# Patient Record
Sex: Male | Born: 1935 | Race: White | Hispanic: No | Marital: Married
Health system: Southern US, Community
[De-identification: ages and names within clinical notes are randomized; demographics above are authoritative.]

## PROBLEM LIST (undated history)

## (undated) DIAGNOSIS — E785 Hyperlipidemia, unspecified: Secondary | ICD-10-CM

## (undated) DIAGNOSIS — H903 Sensorineural hearing loss, bilateral: Secondary | ICD-10-CM

## (undated) DIAGNOSIS — F3132 Bipolar disorder, current episode depressed, moderate: Secondary | ICD-10-CM

## (undated) DIAGNOSIS — I2510 Atherosclerotic heart disease of native coronary artery without angina pectoris: Secondary | ICD-10-CM

## (undated) DIAGNOSIS — F32A Depression, unspecified: Secondary | ICD-10-CM

## (undated) DIAGNOSIS — N400 Enlarged prostate without lower urinary tract symptoms: Secondary | ICD-10-CM

## (undated) DIAGNOSIS — N35919 Unspecified urethral stricture, male, unspecified site: Secondary | ICD-10-CM

## (undated) DIAGNOSIS — C61 Malignant neoplasm of prostate: Secondary | ICD-10-CM

## (undated) DIAGNOSIS — I10 Essential (primary) hypertension: Secondary | ICD-10-CM

## (undated) DIAGNOSIS — Z8546 Personal history of malignant neoplasm of prostate: Secondary | ICD-10-CM

## (undated) DIAGNOSIS — Z86010 Personal history of colonic polyps: Secondary | ICD-10-CM

## (undated) DIAGNOSIS — Z973 Presence of spectacles and contact lenses: Secondary | ICD-10-CM

## (undated) DIAGNOSIS — N529 Male erectile dysfunction, unspecified: Secondary | ICD-10-CM

## (undated) DIAGNOSIS — N401 Enlarged prostate with lower urinary tract symptoms: Secondary | ICD-10-CM

## (undated) DIAGNOSIS — Z972 Presence of dental prosthetic device (complete) (partial): Secondary | ICD-10-CM

## (undated) DIAGNOSIS — Z860101 Personal history of adenomatous and serrated colon polyps: Secondary | ICD-10-CM

## (undated) DIAGNOSIS — Z87440 Personal history of urinary (tract) infections: Secondary | ICD-10-CM

## (undated) DIAGNOSIS — E291 Testicular hypofunction: Secondary | ICD-10-CM

## (undated) DIAGNOSIS — R339 Retention of urine, unspecified: Secondary | ICD-10-CM

## (undated) DIAGNOSIS — N138 Other obstructive and reflux uropathy: Secondary | ICD-10-CM

## (undated) DIAGNOSIS — I252 Old myocardial infarction: Secondary | ICD-10-CM

## (undated) DIAGNOSIS — K5730 Diverticulosis of large intestine without perforation or abscess without bleeding: Secondary | ICD-10-CM

## (undated) HISTORY — PX: GREEN LIGHT LASER TURP (TRANSURETHRAL RESECTION OF PROSTATE: SHX6260

## (undated) HISTORY — PX: CARDIOVASCULAR STRESS TEST: SHX262

## (undated) HISTORY — DX: Hyperlipidemia, unspecified: E78.5

## (undated) HISTORY — PX: OTHER SURGICAL HISTORY: SHX169

---

## 1989-06-05 HISTORY — PX: CORONARY ANGIOPLASTY: SHX604

## 1998-03-03 ENCOUNTER — Ambulatory Visit (HOSPITAL_COMMUNITY): Admission: RE | Admit: 1998-03-03 | Discharge: 1998-03-03 | Payer: Self-pay | Admitting: Cardiology

## 2005-10-04 ENCOUNTER — Encounter: Payer: Self-pay | Admitting: Cardiology

## 2006-02-07 ENCOUNTER — Encounter: Payer: Self-pay | Admitting: Cardiology

## 2006-06-15 ENCOUNTER — Ambulatory Visit (HOSPITAL_COMMUNITY): Admission: RE | Admit: 2006-06-15 | Discharge: 2006-06-15 | Payer: Self-pay | Admitting: Internal Medicine

## 2007-02-12 ENCOUNTER — Ambulatory Visit: Payer: Self-pay | Admitting: Internal Medicine

## 2007-03-05 ENCOUNTER — Emergency Department (HOSPITAL_COMMUNITY): Admission: EM | Admit: 2007-03-05 | Discharge: 2007-03-05 | Payer: Self-pay | Admitting: Emergency Medicine

## 2007-03-06 ENCOUNTER — Emergency Department (HOSPITAL_COMMUNITY): Admission: EM | Admit: 2007-03-06 | Discharge: 2007-03-06 | Payer: Self-pay | Admitting: Emergency Medicine

## 2007-04-25 ENCOUNTER — Ambulatory Visit: Payer: Self-pay | Admitting: Internal Medicine

## 2007-08-27 DIAGNOSIS — E785 Hyperlipidemia, unspecified: Secondary | ICD-10-CM

## 2007-08-27 DIAGNOSIS — I2510 Atherosclerotic heart disease of native coronary artery without angina pectoris: Secondary | ICD-10-CM

## 2007-08-27 DIAGNOSIS — K648 Other hemorrhoids: Secondary | ICD-10-CM | POA: Insufficient documentation

## 2007-08-27 DIAGNOSIS — I252 Old myocardial infarction: Secondary | ICD-10-CM | POA: Insufficient documentation

## 2007-08-27 DIAGNOSIS — Z87898 Personal history of other specified conditions: Secondary | ICD-10-CM

## 2007-08-27 DIAGNOSIS — K5730 Diverticulosis of large intestine without perforation or abscess without bleeding: Secondary | ICD-10-CM | POA: Insufficient documentation

## 2008-01-14 ENCOUNTER — Ambulatory Visit: Admission: RE | Admit: 2008-01-14 | Discharge: 2008-02-18 | Payer: Self-pay | Admitting: Radiation Oncology

## 2008-03-26 ENCOUNTER — Ambulatory Visit: Admission: RE | Admit: 2008-03-26 | Discharge: 2008-05-11 | Payer: Self-pay | Admitting: Radiation Oncology

## 2008-03-27 LAB — PSA: PSA: 4.18 ng/mL — ABNORMAL HIGH (ref 0.10–4.00)

## 2008-06-03 ENCOUNTER — Encounter: Admission: RE | Admit: 2008-06-03 | Discharge: 2008-06-03 | Payer: Self-pay | Admitting: Urology

## 2008-06-10 ENCOUNTER — Ambulatory Visit (HOSPITAL_BASED_OUTPATIENT_CLINIC_OR_DEPARTMENT_OTHER): Admission: RE | Admit: 2008-06-10 | Discharge: 2008-06-10 | Payer: Self-pay | Admitting: Urology

## 2008-06-10 HISTORY — PX: OTHER SURGICAL HISTORY: SHX169

## 2008-06-25 ENCOUNTER — Ambulatory Visit: Admission: RE | Admit: 2008-06-25 | Discharge: 2008-08-06 | Payer: Self-pay | Admitting: Radiation Oncology

## 2008-08-27 ENCOUNTER — Ambulatory Visit: Payer: Self-pay | Admitting: Urology

## 2008-08-31 ENCOUNTER — Ambulatory Visit: Payer: Self-pay | Admitting: Urology

## 2009-05-11 DIAGNOSIS — E291 Testicular hypofunction: Secondary | ICD-10-CM | POA: Insufficient documentation

## 2009-05-11 DIAGNOSIS — C61 Malignant neoplasm of prostate: Secondary | ICD-10-CM | POA: Insufficient documentation

## 2009-05-11 DIAGNOSIS — F331 Major depressive disorder, recurrent, moderate: Secondary | ICD-10-CM | POA: Insufficient documentation

## 2010-04-21 ENCOUNTER — Ambulatory Visit: Payer: Self-pay | Admitting: Cardiology

## 2010-06-05 HISTORY — PX: TRANSURETHRAL RESECTION OF PROSTATE: SHX73

## 2010-10-18 NOTE — Op Note (Signed)
NAMENORI, POLAND               ACCOUNT NO.:  [id number]   MEDICAL RECORD NO.:  [id number]          PATIENT TYPE:  AMB   LOCATION:  [facility]                         FACILITY:  [facility]   PHYSICIAN:  Maretta Bees. Vonita Moss, M.D.DATE OF BIRTH:  24-Dec-1933   DATE OF PROCEDURE:  06/10/2008  DATE OF DISCHARGE:                               OPERATIVE REPORT   PREOPERATIVE DIAGNOSIS:  Prostatic carcinoma.   POSTOPERATIVE DIAGNOSIS:  Prostatic carcinoma.   PROCEDURE:  Radioactive seed implantation to the prostate and cystoscopy  with seed retrieval.   SURGEON:  Dr. Larey Dresser   ASSISTANT:  Dr. Margaretmary Dys   INDICATIONS:  This 77 year old gentleman who is in good health with a  long history of bladder outlet obstructive symptoms was diagnosed as  having Gleason 6 carcinoma of the prostate.  He was counseled by me  regarding observation, surgery and the option of radiotherapy.  He also  saw Dr. Eldridge Abrahams at [facility] for a urologic opinion and also saw Dr. Nedra Hai,  the radiation oncologist at [facility].  He opted to proceed with radioactive  seed implantation here after consultation with Dr. Kathrynn Running.  He was  advised about postoperative voiding complaints and frequency.  He has  longstanding bladder outlet obstructive symptoms.  He has been on one  Flomax a day.  He has a known small median lobe of the prostate which  was diagnosed a while ago and he opted for continued medical therapy.   PROCEDURE:  The patient brought to operating room and placed in the  lithotomy position.  The external genitalia and perineum were prepped  and draped in the usual fashion.  A Foley catheter was inserted.  A  rectal tube was inserted.  Transrectal ultrasound was performed and seed  placement planning was completed and then with two anchoring needles in  place, 23 active needles were placed delivering 65 seeds in good  fluoroscopic and ultrasonic position.  However, at this point I  cystoscoped him and there was a  seed just protruding from the floor of  the prostate and just proximal to the verumontanum.  Fortunately I was  able to remove this seed with a grasper and the  rest of the urethra and the bladder was unremarkable except for a  trabeculated bladder and a small median lobe of the prostate.  A Foley  catheter was inserted.  He was taken to recovery room in good condition  having tolerated the procedure well.      Maretta Bees. Vonita Moss, M.D.  Electronically Signed     LJP/MEDQ  D:  06/10/2008  T:  06/10/2008  Job:  [id number]   cc:   Artist Pais Kathrynn Running, M.D.  Fax: [phone]   Gaspar Garbe, M.D.  Fax: [phone]

## 2010-10-18 NOTE — Assessment & Plan Note (Signed)
[facility]                         GASTROENTEROLOGY OFFICE NOTE   [name], [name]                      MRN:          [id number]  DATE:02/12/2007                            DOB:          20-Apr-1934    REASON FOR CONSULTATION:  Surveillance colonoscopy.   HISTORY:  This is a 77 year old white male with a history of coronary  artery disease and dyslipidemia who presents today regarding  surveillance colonoscopy.  He initially underwent colonoscopy June 10, 2003 for routine screening.  He was found to have multiple colon polyps  which were removed and found to be both adenomatous as well as  hyperplastic in nature, also pandiverticulosis and hemorrhoids.  He  tells me that last November he had problems with moderately severe  rectal bleeding which resolved in a day or so.  No recurrent problems  with bleeding.  He denies any other GI complaints.  He has had no  significant interval medical problems.   ALLERGIES:  SULFA.   CURRENT MEDICATIONS:  1. Flomax 0.4 mg daily.  2. Lipitor 20 mg daily.  3. Aspirin 81 mg daily.  4. Multivitamin.   PAST MEDICAL HISTORY:  1. Coronary artery disease.  2. Dyslipidemia.  3. Benign prostatic hypertrophy.   FAMILY HISTORY:  Sister with colon cancer at age 16, also sister with a  history of ovarian cancer.  Brother with prostate and pancreatic cancer.   SOCIAL HISTORY:  Patient is married with 2 sons.  Lives with his wife.  Retired from [employer]  Does not smoke.  Occasionally uses alcohol.   REVIEW OF SYSTEMS:  Per diagnostic evaluation form.   PHYSICAL EXAMINATION:  Well-appearing male, no acute distress.  Blood  pressure 136/94m heart rate is 60 and regular, weight is 163 pounds, he  is 5 feet 8 inches in height.  HEENT:  Sclerae are anicteric, conjunctivae are pink, oral mucosa is  intact.  No adenopathy.  LUNGS:  Clear.  HEART:  Regular.  ABDOMEN:  Soft without tenderness, mass or hernia.  RECTAL:  Deferred.  EXTREMITIES:  Without edema.   IMPRESSION:  1. Prior problems with transient rectal bleeding, cause uncertain.      Possibly hemorrhoids.  2. History of adenomatous colon polyps.  3. History of diverticulosis.  4. Family history of colon cancer.   RECOMMENDATIONS:  Colonoscopy with polypectomy if necessary.  The nature  of the procedure as well as the risks, benefits and alternatives were  discussed in detail.  He understood and agreed to proceed.     Wilhemina Bonito. Marina Goodell, MD  Electronically Signed    JNP/MedQ  DD: 02/12/2007  DT: 02/13/2007  Job #: [id number]   cc:   Gaspar Garbe, M.D.

## 2011-01-31 DIAGNOSIS — H538 Other visual disturbances: Secondary | ICD-10-CM | POA: Insufficient documentation

## 2011-03-07 LAB — COMPREHENSIVE METABOLIC PANEL
AST: 31 U/L (ref 0–37)
Albumin: 3.7 g/dL (ref 3.5–5.2)
Alkaline Phosphatase: 50 U/L (ref 39–117)
BUN: 11 mg/dL (ref 6–23)
GFR calc Af Amer: >60 mL/min (ref >60)
Potassium: 4 mEq/L (ref 3.5–5.1)
Total Protein: 6.1 g/dL (ref 6.0–8.3)

## 2011-03-07 LAB — PROTIME-INR: INR: 1 (ref 0.00–1.49)

## 2011-03-07 LAB — APTT: aPTT: 28 seconds (ref 24–37)

## 2011-03-07 LAB — CBC
HCT: 42.4 % (ref 39.0–52.0)
Platelets: 183 10*3/uL (ref 150–400)
RDW: 14.3 % (ref 11.5–15.5)

## 2011-03-10 LAB — COMPREHENSIVE METABOLIC PANEL
ALT: 45 U/L (ref 0–53)
Alkaline Phosphatase: 47 U/L (ref 39–117)
CO2: 31 mEq/L (ref 19–32)
GFR calc non Af Amer: >60 mL/min (ref >60)
Glucose, Bld: 88 mg/dL (ref 70–99)
Potassium: 4.8 mEq/L (ref 3.5–5.1)
Sodium: 139 mEq/L (ref 135–145)

## 2011-03-10 LAB — CBC
Hemoglobin: 14.4 g/dL (ref 13.0–17.0)
MCHC: 33.4 g/dL (ref 30.0–36.0)
RBC: 4.65 MIL/uL (ref 4.22–5.81)

## 2011-03-10 LAB — PROTIME-INR: Prothrombin Time: 13.7 seconds (ref 11.6–15.2)

## 2011-03-16 LAB — BASIC METABOLIC PANEL
CO2: 28
Calcium: 10.1
Creatinine, Ser: 1.12
Glucose, Bld: 118 — ABNORMAL HIGH

## 2011-03-16 LAB — SAMPLE TO BLOOD BANK

## 2011-03-16 LAB — CBC
MCHC: 33.2
RDW: 14.1 — ABNORMAL HIGH

## 2011-06-29 DIAGNOSIS — Z2839 Other underimmunization status: Secondary | ICD-10-CM | POA: Insufficient documentation

## 2011-06-29 DIAGNOSIS — R339 Retention of urine, unspecified: Secondary | ICD-10-CM | POA: Insufficient documentation

## 2011-10-19 ENCOUNTER — Encounter: Payer: Self-pay | Admitting: *Deleted

## 2012-04-12 ENCOUNTER — Encounter: Payer: Self-pay | Admitting: Internal Medicine

## 2012-06-03 ENCOUNTER — Other Ambulatory Visit: Payer: Self-pay | Admitting: Urology

## 2012-06-06 ENCOUNTER — Encounter (HOSPITAL_BASED_OUTPATIENT_CLINIC_OR_DEPARTMENT_OTHER): Payer: Self-pay | Admitting: *Deleted

## 2012-06-06 NOTE — Progress Notes (Addendum)
Pt instructed npo p mn 1/5 x lipitor w sip of water.  To [facility] 1/6 @1030 .  Needs hgb,psa, ekg on arrival

## 2012-06-07 NOTE — H&P (Signed)
Reason For Visit      [name] [name] presents today for followup. When I saw him for the first time in August 2013 he was having moderate to significant voiding complaints and had a recent episode of acute cystitis. He had a postvoid residual 160 cc at that time we recommended an alpha-blocker followed by a followup cystoscopy. He canceled that appointment he did not take the alpha-blocker. The patient subsequently came in and saw one of our nurse practitioners with really what sounds like almost an overflow situation. Postvoid residual was over 600 cc the patient refused a catheter. He does tell me he has been doing in and out catheterizations but it's unclear to me if that's really true or not. He has started taking the alpha-blocker and feels like it's May things a little better but not dramatically so. Urine today is clear.   History of Present Illness     Past urologic history:     [name] [name] (patient goes by [name] [name]) comes in today to reestablish with our practice.  This is my first visit with him.  The patient had been under the care of Dr. Larey Dresser.  The patient was diagnosed with what appeared to be favorable to intermediate risk prostate cancer back approximately 3 years ago.  The patient had low volume Gleason 6 versus Gleason 7 depending on which pathologist reviewed his slides.  He subsequently had a seed implantation done in February of 2010.  That was complicated by some urinary retention which required several months of self catheterization.  The patient subsequently decided to seek treatment in [place] and underwent a GreenLight laser of an apparent middle lobe of his prostate.  The patient was able to subsequently void.    [name] [name] tells me that subsequent to that his PSA has been checked and has been stable around 0.4.  This was last checked in October of 2012.  The patient did have asymptomatic urinary tract infection treated in January of this year.  The patient was noted by Dr.  Artis Flock in [place] to have evidence of cystitis despite no symptoms.  He also had microhematuria and some type of upper tract imaging studies along with cystoscopy and nothing of concern was noted other than some ongoing inflammation within the prostatic fossa.  We do not have those records.  The patient is currently on 5 mg of Cialis. He is currently off the alpha-blocker.  He does have some mild to moderate nocturia as well as some questionable incomplete bladder emptying.  Urinalysis today does show ongoing pyuria and bacteruria.        Past Medical History Problems  1. History of  Acute Myocardial Infarction V12.59 2. History of  Heart Disease 429.9  Surgical History Problems  1. History of  Cystoscopy With Removal Of Object 2. History of  Heart Surgery 3. History of  Surgery Prostate Transperineal Placement Of Needles  Current Meds 1. Aspirin 81 MG Oral Tablet; Therapy: (Recorded:25Jan2008) to 2. Cialis 20 MG Oral Tablet; Take as directed; Therapy: 01Aug2013 to (Last Rx:01Aug2013) 3. Fish Oil CAPS; Therapy: (Recorded:26Jan2009) to 4. Lipitor 10 MG Oral Tablet; Therapy: (Recorded:25Jan2008) to 5. Multi-Vitamin TABS; Therapy: (Recorded:25Jan2008) to 6. Tamsulosin HCl 0.4 MG Oral Capsule; TAKE 1 CAPSULE Daily; Therapy: 06Dec2013 to  (Evaluate:06Mar2014)  Requested for: 06Dec2013; Last Rx:06Dec2013  Allergies Medication  1. Sulfa Drugs  Family History Problems  1. Sororal history of  Cancer 2. Fraternal history of  Cancer 3. Paternal history of  Death In The Family  Father Lung CancerAge 76 4. Maternal history of  Death In The Family Mother NaturalAge 59 5. Paternal history of  Lung Cancer V16.1 6. Family history of  Prostate Cancer V16.42 7. Fraternal history of  Prostate Cancer V16.42  Social History Problems  1. Alcohol Use 1/wk 2. Caffeine Use 2 3. Marital History - Currently Married 4. History of  Tobacco Use V15.82 quit 50 years ago; smoked one pack per day for  8 years  Review of Systems Genitourinary, constitutional, skin, eye, otolaryngeal, hematologic/lymphatic, cardiovascular, pulmonary, endocrine, musculoskeletal, gastrointestinal, neurological and psychiatric system(s) were reviewed and pertinent findings if present are noted.  Genitourinary: nocturia (1x), difficulty starting the urinary stream, weak urinary stream, urinary stream starts and stops and incomplete emptying of bladder, but no dysuria.    Vitals Vital Signs [Data Includes: Last 1 Day]  20Dec2013 02:24PM  Blood Pressure: 160 / 86 Temperature: 98.7 F Heart Rate: 75  Physical Exam Constitutional: Well nourished and well developed . No acute distress.  Neck: The appearance of the neck is normal and no neck mass is present.  Pulmonary: No respiratory distress and normal respiratory rhythm and effort.  Abdomen: The abdomen is soft and nontender. No masses are palpated. No CVA tenderness. No hernias are palpable. No hepatosplenomegaly noted.    Procedure  Procedure: Cystoscopy   Indication: Lower Urinary Tract Symptoms. Urethral Stricture.  Informed Consent: Risks, benefits, and potential adverse events were discussed and informed consent was obtained. Specific risks including, but not limited to bleeding, infection, pain, allergic reaction etc. were explained.  Prep: The patient was prepped with betadine.  Anesthesia:. Local anesthesia was administered intraurethrally with 2% lidocaine jelly.  Procedure Note:  Urethral meatus:. No abnormalities.  Anterior urethra: No abnormalities.  Prostatic urethra:. A bladder neck contracture was present. (severe 1-2 F).  Bladder: not able to vu=isualize secondary to [facility] The patient tolerated the procedure well.  Complications: None.    Assessment Assessed  1. Incomplete Emptying Of Bladder 788.21 2. Benign Prostatic Hypertrophy With Urinary Obstruction 600.01 3. 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 Prostate Cancer 185  Plan Prostate  Cancer (185)  1. Cath for Specimen [facility]) 2. Cysto  Done: 20Dec2013  Discussion/Summary  Mr. [name] [name] has evidence of a severe bladder neck contracture. Cystoscopically this looks no bigger than 1-2 [place]. I suspect he may have almost obliterated bladder neck. I really don't believe that he's been able to do in and out catheterizations at home given what I see today. We had attempted before the cystoscopy to do several catheterizations which were unsuccessful due to the substantial narrowing. I don't think he would do well with any attempts at office manipulation. I would propose cystoscopy with guidewire placement into the bladder under fluoroscopy and an attempt at balloon dilation. He males require some laser incisions of the bladder neck and then careful assessment of the situation. I do think is reasonable chance we can open things up with a history of radiation of recurrence is going to be very likely and we may have to talk about some alternatives going forward. This is not an easy situation to deal with with a history of radiation and prior surgery. From a prostate cancer standpoint his PSA data has looked encouraging. We'll plan on getting a PSA as part of his preoperative blood work. I recommended this procedure as soon as possible but he wants to wait to lateral holidays. He will have to contact us sooner if he is unable to void.   Quarry manager  signed by : Barron Alvine, M.D.; May 28 2012  8:01AM

## 2012-06-10 ENCOUNTER — Encounter (HOSPITAL_BASED_OUTPATIENT_CLINIC_OR_DEPARTMENT_OTHER): Payer: Self-pay | Admitting: Anesthesiology

## 2012-06-10 ENCOUNTER — Ambulatory Visit (HOSPITAL_BASED_OUTPATIENT_CLINIC_OR_DEPARTMENT_OTHER): Payer: Medicare Other | Admitting: Anesthesiology

## 2012-06-10 ENCOUNTER — Ambulatory Visit (HOSPITAL_BASED_OUTPATIENT_CLINIC_OR_DEPARTMENT_OTHER)
Admission: RE | Admit: 2012-06-10 | Discharge: 2012-06-10 | Disposition: A | Discharge status: Home / Self Care | Payer: Medicare Other | Source: Ambulatory Visit | Attending: Urology | Admitting: Urology

## 2012-06-10 ENCOUNTER — Encounter (HOSPITAL_BASED_OUTPATIENT_CLINIC_OR_DEPARTMENT_OTHER)
Admission: RE | Disposition: A | Discharge status: Home / Self Care | Payer: Self-pay | Source: Ambulatory Visit | Attending: Urology

## 2012-06-10 ENCOUNTER — Other Ambulatory Visit: Payer: Self-pay

## 2012-06-10 ENCOUNTER — Encounter (HOSPITAL_BASED_OUTPATIENT_CLINIC_OR_DEPARTMENT_OTHER): Payer: Self-pay | Admitting: *Deleted

## 2012-06-10 DIAGNOSIS — N138 Other obstructive and reflux uropathy: Secondary | ICD-10-CM | POA: Insufficient documentation

## 2012-06-10 DIAGNOSIS — C61 Malignant neoplasm of prostate: Secondary | ICD-10-CM | POA: Insufficient documentation

## 2012-06-10 DIAGNOSIS — Z01810 Encounter for preprocedural cardiovascular examination: Secondary | ICD-10-CM | POA: Insufficient documentation

## 2012-06-10 DIAGNOSIS — Z01812 Encounter for preprocedural laboratory examination: Secondary | ICD-10-CM | POA: Insufficient documentation

## 2012-06-10 DIAGNOSIS — N320 Bladder-neck obstruction: Secondary | ICD-10-CM | POA: Insufficient documentation

## 2012-06-10 DIAGNOSIS — N401 Enlarged prostate with lower urinary tract symptoms: Secondary | ICD-10-CM | POA: Insufficient documentation

## 2012-06-10 HISTORY — DX: Essential (primary) hypertension: I10

## 2012-06-10 HISTORY — DX: Unspecified urethral stricture, male, unspecified site: N35.919

## 2012-06-10 HISTORY — PX: CYSTOSCOPY WITH URETHRAL DILATATION: SHX5125

## 2012-06-10 LAB — PSA: PSA: 0.15 ng/mL (ref <=4.00)

## 2012-06-10 SURGERY — CYSTOSCOPY, WITH URETHRAL DILATION
Anesthesia: General | Site: Ureter | Wound class: Clean Contaminated

## 2012-06-10 MED ORDER — STERILE WATER FOR IRRIGATION IR SOLN
Status: DC | PRN
  Administered 2012-06-10: 3000 mL

## 2012-06-10 MED ORDER — MEPERIDINE HCL 25 MG/ML IJ SOLN
6.2500 mg | INTRAMUSCULAR | Status: DC | PRN
  Filled 2012-06-10: qty 1

## 2012-06-10 MED ORDER — HYDROCODONE-ACETAMINOPHEN 5-325 MG PO TABS
1.0000 | ORAL_TABLET | Freq: Once | ORAL | Status: AC
  Administered 2012-06-10: 1 via ORAL
  Filled 2012-06-10: qty 1

## 2012-06-10 MED ORDER — LIDOCAINE HCL 2 % EX GEL
CUTANEOUS | Status: DC | PRN
  Administered 2012-06-10: 1

## 2012-06-10 MED ORDER — OXYCODONE HCL 5 MG/5ML PO SOLN
5.0000 mg | Freq: Once | ORAL | Status: DC | PRN
  Filled 2012-06-10: qty 5

## 2012-06-10 MED ORDER — LACTATED RINGERS IV SOLN
INTRAVENOUS | Status: DC
  Administered 2012-06-10: 11:00:00 via INTRAVENOUS
  Filled 2012-06-10: qty 1000

## 2012-06-10 MED ORDER — HYDROMORPHONE HCL PF 1 MG/ML IJ SOLN
0.2500 mg | INTRAMUSCULAR | Status: DC | PRN
  Filled 2012-06-10: qty 1

## 2012-06-10 MED ORDER — CIPROFLOXACIN HCL 250 MG PO TABS: 250.0000 mg | ORAL_TABLET | Freq: Two times a day (BID) | ORAL | Status: DC

## 2012-06-10 MED ORDER — PROMETHAZINE HCL 25 MG/ML IJ SOLN
6.2500 mg | INTRAMUSCULAR | Status: DC | PRN
  Filled 2012-06-10: qty 1

## 2012-06-10 MED ORDER — OXYCODONE HCL 5 MG PO TABS
5.0000 mg | ORAL_TABLET | Freq: Once | ORAL | Status: DC | PRN
  Filled 2012-06-10: qty 1

## 2012-06-10 MED ORDER — HYDROCODONE-ACETAMINOPHEN 5-325 MG PO TABS: 1.0000 | ORAL_TABLET | Freq: Four times a day (QID) | ORAL | Status: DC | PRN

## 2012-06-10 MED ORDER — IOHEXOL 350 MG/ML SOLN
INTRAVENOUS | Status: DC | PRN
  Administered 2012-06-10: 10 mL

## 2012-06-10 MED ORDER — DEXAMETHASONE SODIUM PHOSPHATE 4 MG/ML IJ SOLN
INTRAMUSCULAR | Status: DC | PRN
  Administered 2012-06-10: 4 mg via INTRAVENOUS

## 2012-06-10 MED ORDER — ACETAMINOPHEN 10 MG/ML IV SOLN
1000.0000 mg | Freq: Once | INTRAVENOUS | Status: DC | PRN
  Filled 2012-06-10: qty 100

## 2012-06-10 MED ORDER — PROPOFOL 10 MG/ML IV BOLUS
INTRAVENOUS | Status: DC | PRN
  Administered 2012-06-10: 150 mg via INTRAVENOUS

## 2012-06-10 MED ORDER — LIDOCAINE HCL (CARDIAC) 20 MG/ML IV SOLN
INTRAVENOUS | Status: DC | PRN
  Administered 2012-06-10: 60 mg via INTRAVENOUS

## 2012-06-10 MED ORDER — ONDANSETRON HCL 4 MG/2ML IJ SOLN
INTRAMUSCULAR | Status: DC | PRN
  Administered 2012-06-10: 4 mg via INTRAVENOUS

## 2012-06-10 MED ORDER — CIPROFLOXACIN IN D5W 400 MG/200ML IV SOLN
400.0000 mg | INTRAVENOUS | Status: AC
  Administered 2012-06-10: 400 mg via INTRAVENOUS
  Filled 2012-06-10: qty 200

## 2012-06-10 MED ORDER — FENTANYL CITRATE 0.05 MG/ML IJ SOLN
INTRAMUSCULAR | Status: DC | PRN
  Administered 2012-06-10: 25 ug via INTRAVENOUS
  Administered 2012-06-10: 50 ug via INTRAVENOUS

## 2012-06-10 SURGICAL SUPPLY — 28 items
BAG DRAIN URO-CYSTO SKYTR STRL (DRAIN) ×3 IMPLANT
BAG URINE LEG 19OZ MD ST LTX (BAG) ×3 IMPLANT
BALLN NEPHROSTOMY (BALLOONS) ×3
BALLOON NEPHROSTOMY (BALLOONS) ×2 IMPLANT
CANISTER SUCT LVC 12 LTR MEDI- (MISCELLANEOUS) ×3 IMPLANT
CATH FOLEY 2W COUNCIL 5CC 18FR (CATHETERS) ×3 IMPLANT
CATH FOLEY 2WAY SLVR  5CC 16FR (CATHETERS)
CATH FOLEY 2WAY SLVR 5CC 16FR (CATHETERS) IMPLANT
CATH INTERMIT  6FR 70CM (CATHETERS) ×3 IMPLANT
CLOTH BEACON ORANGE TIMEOUT ST (SAFETY) ×3 IMPLANT
DRAPE CAMERA CLOSED 9X96 (DRAPES) ×3 IMPLANT
ELECT REM PT RETURN 9FT ADLT (ELECTROSURGICAL) ×3
ELECTRODE REM PT RTRN 9FT ADLT (ELECTROSURGICAL) ×2 IMPLANT
GLOVE BIO SURGEON STRL SZ7.5 (GLOVE) ×3 IMPLANT
GLOVE BIOGEL PI IND STRL 6.5 (GLOVE) ×4 IMPLANT
GLOVE BIOGEL PI INDICATOR 6.5 (GLOVE) ×2
GLOVE INDICATOR 6.5 STRL GRN (GLOVE) ×6 IMPLANT
GOWN STRL REIN XL XLG (GOWN DISPOSABLE) ×3 IMPLANT
GOWN XL W/COTTON TOWEL STD (GOWNS) ×3 IMPLANT
GUIDEWIRE STR DUAL SENSOR (WIRE) IMPLANT
NDL SAFETY ECLIPSE 18X1.5 (NEEDLE) IMPLANT
NEEDLE HYPO 18GX1.5 SHARP (NEEDLE)
NEEDLE HYPO 22GX1.5 SAFETY (NEEDLE) IMPLANT
NS IRRIG 500ML POUR BTL (IV SOLUTION) IMPLANT
PACK CYSTOSCOPY (CUSTOM PROCEDURE TRAY) ×3 IMPLANT
SYR 20CC LL (SYRINGE) IMPLANT
SYR 30ML LL (SYRINGE) IMPLANT
WATER STERILE IRR 3000ML UROMA (IV SOLUTION) ×3 IMPLANT

## 2012-06-10 NOTE — Interval H&P Note (Signed)
History and Physical Interval Note:  06/10/2012 11:03 AM  [name] [name]  has presented today for surgery, with the diagnosis of BLADDER NECK CONTRACTURE, PROSTATE CANCER  The various methods of treatment have been discussed with the patient and family. After consideration of risks, benefits and other options for treatment, the patient has consented to  Procedure(s) (LRB) with comments: CYSTOSCOPY WITH URETHRAL DILATATION (N/A) - 30 MIN BALLOON DILATION POSSIBLE LASER INCISION OF BLADDER NECK CONTRACTURE  HOLMIUM LASER APPLICATION (N/A) as a surgical intervention .  The patient's history has been reviewed, patient examined, no change in status, stable for surgery.  I have reviewed the patient's chart and labs.  Questions were answered to the patient's satisfaction.     [name] S

## 2012-06-10 NOTE — Anesthesia Postprocedure Evaluation (Signed)
Anesthesia Post Note  Patient: [name] [name]  Procedure(s) Performed: Procedure(s) (LRB): CYSTOSCOPY WITH URETHRAL DILATATION (N/A)  Anesthesia type: General  Patient location: PACU  Post pain: Pain level controlled  Post assessment: Post-op Vital signs reviewed  Last Vitals: BP 160/88  Pulse 66  Temp 36.6 C (Oral)  Resp 14  Ht 5\' 8"  (1.727 m)  Wt 169 lb (76.658 kg)  BMI 25.70 kg/m2  SpO2 99%  Post vital signs: Reviewed  Level of consciousness: sedated  Complications: No apparent anesthesia complications

## 2012-06-10 NOTE — Anesthesia Preprocedure Evaluation (Addendum)
Anesthesia Evaluation  Patient identified by MRN, date of birth, ID band Patient awake    Reviewed: Allergy & Precautions, H&P , NPO status , Patient's Chart, lab work & pertinent test results  Airway Mallampati: I TM Distance: >3 FB Neck ROM: Full    Dental  (+) Dental Advisory Given, Partial Upper and Partial Lower   Pulmonary neg pulmonary ROS,  breath sounds clear to auscultation  Pulmonary exam normal       Cardiovascular hypertension, Pt. on medications + CAD and + Past MI Rhythm:Regular Rate:Normal     Neuro/Psych negative neurological ROS  negative psych ROS   GI/Hepatic negative GI ROS, Neg liver ROS,   Endo/Other  negative endocrine ROS  Renal/GU negative Renal ROS     Musculoskeletal negative musculoskeletal ROS (+)   Abdominal   Peds  Hematology negative hematology ROS (+)   Anesthesia Other Findings   Reproductive/Obstetrics                         Anesthesia Physical Anesthesia Plan  ASA: II  Anesthesia Plan: General   Post-op Pain Management:    Induction: Intravenous  Airway Management Planned: LMA  Additional Equipment:   Intra-op Plan:   Post-operative Plan: Extubation in OR  Informed Consent: I have reviewed the patients History and Physical, chart, labs and discussed the procedure including the risks, benefits and alternatives for the proposed anesthesia with the patient or authorized representative who has indicated his/her understanding and acceptance.   Dental advisory given  Plan Discussed with: CRNA  Anesthesia Plan Comments:         Anesthesia Quick Evaluation

## 2012-06-10 NOTE — Anesthesia Procedure Notes (Signed)
Procedure Name: LMA Insertion Date/Time: 06/10/2012 11:28 AM Performed by: Renella Cunas D Pre-anesthesia Checklist: Patient identified, Emergency Drugs available, Suction available and Patient being monitored Patient Re-evaluated:Patient Re-evaluated prior to inductionOxygen Delivery Method: Circle System Utilized Preoxygenation: Pre-oxygenation with 100% oxygen Intubation Type: IV induction Ventilation: Mask ventilation without difficulty LMA: LMA inserted LMA Size: 4.0 Number of attempts: 1 Airway Equipment and Method: bite block Placement Confirmation: positive ETCO2 Tube secured with: Tape Dental Injury: Teeth and Oropharynx as per pre-operative assessment

## 2012-06-10 NOTE — Transfer of Care (Signed)
Immediate Anesthesia Transfer of Care Note  Patient: [name] [name]  Procedure(s) Performed: Procedure(s) (LRB): CYSTOSCOPY WITH URETHRAL DILATATION (N/A)  Patient Location: PACU  Anesthesia Type: General  Level of Consciousness: awake, oriented, sedated and patient cooperative  Airway & Oxygen Therapy: Patient Spontanous Breathing and Patient connected to face mask oxygen  Post-op Assessment: Report given to PACU RN and Post -op Vital signs reviewed and stable  Post vital signs: Reviewed and stable  Complications: No apparent anesthesia complications

## 2012-06-10 NOTE — Op Note (Signed)
Preoperative diagnosis: Bladder neck contracture with history of prostate cancer status post radiation therapy Postoperative diagnosis: Same  Procedure: Cystoscopy with dilation of bladder neck contracture.   Surgeon: Valetta Fuller M.D.  Anesthesia: Gen.  Indications: Patient was treated approximately 3 years ago with seed implantation for prostate cancer. PSA data has been encouraging. The patient has had issues with voiding status post that procedure. An outside facility the patient underwent several greenlight laser treatments for BPH. The patient recently was noted to have a significant voiding dysfunction with elevated postvoid residual of over 600 cc. We perform cystoscopy in the office and found that he had almost a completely obliterated bladder neck with a pinhole opening. We strongly recommended surgical intervention with the understanding that recurrence is likely in this situation.     Technique and findings: Patient was brought the operating room where he had successful induction of general anesthesia. He was placed in lithotomy position prepped and draped in usual manner. The patient received perioperative antibiotics and placement of PAS compression boots. Cystoscopy again revealed unremarkable anterior urethra. Within the prostatic fossa there was a small amount of tissue nothing that appeared to be obstructing. The bladder neck however showed a pinhole opening. A guidewire was placed through this opening and into the bladder confirmed with fluoroscopic interpretation. Initial attempts to place a dilating balloon through this area were unsuccessful because this was so tight and dense. We ended up using some Heyman dilators from 10 [place] to 14 [place] over the guidewire and then we were able to place the 24 French fascial dilating balloon. The balloon was inflated to approximately 15 atmospheres pressure. It was held inflated for approximately 5 minutes. The balloon was then deflated and  removed. We were able to then get the 24 French cystoscope through this area. The bladder did not show any other pathology other than severe trabecular change with multiple diverticuli/cellules. Over the guidewire I placed an 5 [place] council tip Foley catheter which drained clear urine. The patient will be discharged with a leg bag.

## 2012-06-11 ENCOUNTER — Encounter (HOSPITAL_BASED_OUTPATIENT_CLINIC_OR_DEPARTMENT_OTHER): Payer: Self-pay | Admitting: Urology

## 2012-06-12 LAB — POCT HEMOGLOBIN-HEMACUE: Hemoglobin: 15.1 g/dL (ref 13.0–17.0)

## 2012-09-26 ENCOUNTER — Encounter: Payer: Self-pay | Admitting: Internal Medicine

## 2013-07-10 DIAGNOSIS — E785 Hyperlipidemia, unspecified: Secondary | ICD-10-CM | POA: Insufficient documentation

## 2013-07-17 ENCOUNTER — Encounter: Payer: Self-pay | Admitting: Internal Medicine

## 2013-09-02 ENCOUNTER — Encounter: Payer: Self-pay | Admitting: Internal Medicine

## 2013-09-25 ENCOUNTER — Encounter: Payer: Medicare Other | Admitting: Internal Medicine

## 2013-10-02 ENCOUNTER — Encounter: Payer: Medicare Other | Admitting: Internal Medicine

## 2014-01-08 ENCOUNTER — Encounter: Payer: Self-pay | Admitting: Internal Medicine

## 2014-02-20 ENCOUNTER — Ambulatory Visit (AMBULATORY_SURGERY_CENTER): Payer: Self-pay | Admitting: *Deleted

## 2014-02-20 VITALS — Ht 67.0 in | Wt 169.2 lb

## 2014-02-20 DIAGNOSIS — Z86010 Personal history of colonic polyps: Secondary | ICD-10-CM

## 2014-02-20 MED ORDER — MOVIPREP 100 G PO SOLR: ORAL | Status: DC

## 2014-02-20 NOTE — Progress Notes (Signed)
No allergies to eggs or soy. No problems with anesthesia.  Pt given Emmi instructions for colonoscopy  No oxygen use  No diet drug use  

## 2014-02-26 ENCOUNTER — Encounter: Payer: Self-pay | Admitting: Internal Medicine

## 2014-03-03 ENCOUNTER — Ambulatory Visit (AMBULATORY_SURGERY_CENTER): Payer: Medicare Other | Admitting: Internal Medicine

## 2014-03-03 ENCOUNTER — Encounter: Payer: Self-pay | Admitting: Internal Medicine

## 2014-03-03 VITALS — BP 142/80 | HR 59 | Temp 98.3°F | Resp 17 | Ht 67.0 in | Wt 169.0 lb

## 2014-03-03 DIAGNOSIS — Z860100 Personal history of colon polyps, unspecified: Secondary | ICD-10-CM

## 2014-03-03 DIAGNOSIS — D126 Benign neoplasm of colon, unspecified: Secondary | ICD-10-CM

## 2014-03-03 DIAGNOSIS — D124 Benign neoplasm of descending colon: Secondary | ICD-10-CM

## 2014-03-03 MED ORDER — SODIUM CHLORIDE 0.9 % IV SOLN: 500.0000 mL | INTRAVENOUS | Status: DC

## 2014-03-03 NOTE — Patient Instructions (Signed)
YOU HAD AN ENDOSCOPIC PROCEDURE TODAY AT THE [facility]: Refer to the procedure report that was given to you for any specific questions about what was found during the examination.  If the procedure report does not answer your questions, please call your gastroenterologist to clarify.  If you requested that your care partner not be given the details of your procedure findings, then the procedure report has been included in a sealed envelope for you to review at your convenience later.  YOU SHOULD EXPECT: Some feelings of bloating in the abdomen. Passage of more gas than usual.  Walking can help get rid of the air that was put into your GI tract during the procedure and reduce the bloating. If you had a lower endoscopy (such as a colonoscopy or flexible sigmoidoscopy) you may notice spotting of blood in your stool or on the toilet paper. If you underwent a bowel prep for your procedure, then you may not have a normal bowel movement for a few days.  DIET: Your first meal following the procedure should be a light meal and then it is ok to progress to your normal diet.  A half-sandwich or bowl of soup is an example of a good first meal.  Heavy or fried foods are harder to digest and may make you feel nauseous or bloated.  Likewise meals heavy in dairy and vegetables can cause extra gas to form and this can also increase the bloating.  Drink plenty of fluids but you should avoid alcoholic beverages for 24 hours.  ACTIVITY: Your care partner should take you home directly after the procedure.  You should plan to take it easy, moving slowly for the rest of the day.  You can resume normal activity the day after the procedure however you should NOT DRIVE or use heavy machinery for 24 hours (because of the sedation medicines used during the test).    SYMPTOMS TO REPORT IMMEDIATELY: A gastroenterologist can be reached at any hour.  During normal business hours, 8:30 AM to 5:00 PM Monday through Friday,  call [phone].  After hours and on weekends, please call the GI answering service at [phone] who will take a message and have the physician on call contact you.   Following lower endoscopy (colonoscopy or flexible sigmoidoscopy):  Excessive amounts of blood in the stool  Significant tenderness or worsening of abdominal pains  Swelling of the abdomen that is new, acute  Fever of 100F or higher  FOLLOW UP: If any biopsies were taken you will be contacted by phone or by letter within the next 1-3 weeks.  Call your gastroenterologist if you have not heard about the biopsies in 3 weeks.  Our staff will call the home number listed on your records the next business day following your procedure to check on you and address any questions or concerns that you may have at that time regarding the information given to you following your procedure. This is a courtesy call and so if there is no answer at the home number and we have not heard from you through the emergency physician on call, we will assume that you have returned to your regular daily activities without incident.  SIGNATURES/CONFIDENTIALITY: You and/or your care partner have signed paperwork which will be entered into your electronic medical record.  These signatures attest to the fact that that the information above on your After Visit Summary has been reviewed and is understood.  Full responsibility of the confidentiality of this   discharge information lies with you and/or your care-partner.     Handouts were given to your care partner on polyps, diverticulosis, a high fiber diet with liberal fluid intake. You may resume your current medications today. Await biopsy results. Please call if any questions or concerns.

## 2014-03-03 NOTE — Progress Notes (Signed)
Called to room to assist during endoscopic procedure.  Patient ID and intended procedure confirmed with present staff. Received instructions for my participation in the procedure from the performing physician.  

## 2014-03-03 NOTE — Progress Notes (Signed)
Report to PACU, RN, vss, BBS= Clear.  

## 2014-03-03 NOTE — Op Note (Signed)
[facility]  [address] [place], [address]   COLONOSCOPY PROCEDURE REPORT  PATIENT: [name], [name]  MR#: [id number] BIRTHDATE: 22-Mar-1934 , 80  yrs. old GENDER: male ENDOSCOPIST: Eustace Quail, MD REFERRED [id number] Program Recall PROCEDURE DATE:  03/03/2014 PROCEDURE:   Colonoscopy with snare polypectomy x1 First Screening Colonoscopy - Avg.  risk and is 50 yrs.  old or older - No.  Prior Negative Screening - Now for repeat screening. N/A  History of Adenoma - Now for follow-up colonoscopy & has been > or = to 3 yrs.  Yes hx of adenoma.  Has been 3 or more years since last colonoscopy.  Polyps Removed Today? Yes. ASA CLASS:   Class II INDICATIONS:surveillance colonoscopy based on a history of adenomatous colonic polyp(s). Index exam 2005 with tubular adenoma; followup 2008 with sessile serrated polyp. Sibling with history of colon cancer at age 69. MEDICATIONS: Monitored anesthesia care and Propofol 200 mg IV  DESCRIPTION OF PROCEDURE:   After the risks benefits and alternatives of the procedure were thoroughly explained, informed consent was obtained.  The digital rectal exam revealed no abnormalities of the rectum.   The LB [id number]  endoscope was introduced through the anus and advanced to the cecum, which was identified by both the appendix and ileocecal valve. No adverse events experienced.   The quality of the prep was excellent, using MoviPrep  The instrument was then slowly withdrawn as the colon was fully examined.  COLON FINDINGS: A sessile polyp measuring 5 mm in size was found in the descending colon.  A polypectomy was performed with a cold snare.  The resection was complete, the polyp tissue was completely retrieved and sent to histology.   There was severe diverticulosis noted in the left colon and right colon.   The colon mucosa was otherwise normal.  Retroflexed views revealed internal hemorrhoids. The time to cecum=1  minutes 33 seconds.  Withdrawal time=8 minutes 14 seconds.  The scope was withdrawn and the procedure completed. COMPLICATIONS: There were no immediate complications.  ENDOSCOPIC IMPRESSION: 1.   Sessile polyp measuring 5 mm in size was found in the descending colon; polypectomy was performed with a cold snare 2.   Diverticulosis was noted in the left colon and right colon 3.   The colon mucosa was otherwise normal  RECOMMENDATIONS: 1. Return to the care of your primary provider.  GI follow up as needed  eSigned:  Eustace Quail, MD 03/03/2014 11:27 AM   cc: Domenick Gong, MD and The Patient

## 2014-03-03 NOTE — Progress Notes (Signed)
No problems noted in the recovery room. maw 

## 2014-03-04 ENCOUNTER — Telehealth: Payer: Self-pay

## 2014-03-04 NOTE — Telephone Encounter (Signed)
  Follow up Call-  Call back number 03/03/2014  Post procedure Call Back phone  # [phone]  Permission to leave phone message Yes     Patient questions:  Do you have a fever, pain , or abdominal swelling? No. Pain Score  0 *  Have you tolerated food without any problems? Yes.    Have you been able to return to your normal activities? Yes.    Do you have any questions about your discharge instructions: Diet   No. Medications  No. Follow up visit  No.  Do you have questions or concerns about your Care? No.  Actions: * If pain score is 4 or above: No action needed, pain <4.

## 2014-03-09 ENCOUNTER — Encounter: Payer: Self-pay | Admitting: Internal Medicine

## 2015-07-29 ENCOUNTER — Encounter: Payer: Self-pay | Admitting: Internal Medicine

## 2015-08-03 LAB — IFOBT (OCCULT BLOOD): IMMUNOLOGICAL FECAL OCCULT BLOOD TEST: NEGATIVE

## 2015-08-30 ENCOUNTER — Ambulatory Visit: Payer: Self-pay | Admitting: Internal Medicine

## 2015-09-14 ENCOUNTER — Encounter: Payer: Self-pay | Admitting: *Deleted

## 2015-09-20 ENCOUNTER — Encounter: Payer: Self-pay | Admitting: Internal Medicine

## 2015-09-20 ENCOUNTER — Ambulatory Visit (INDEPENDENT_AMBULATORY_CARE_PROVIDER_SITE_OTHER): Payer: Medicare Other | Admitting: Internal Medicine

## 2015-09-20 ENCOUNTER — Telehealth (HOSPITAL_COMMUNITY): Payer: Self-pay | Admitting: Radiology

## 2015-09-20 VITALS — BP 154/84 | HR 66 | Ht 67.0 in | Wt 174.0 lb

## 2015-09-20 DIAGNOSIS — I25119 Atherosclerotic heart disease of native coronary artery with unspecified angina pectoris: Secondary | ICD-10-CM

## 2015-09-20 DIAGNOSIS — R0602 Shortness of breath: Secondary | ICD-10-CM | POA: Diagnosis not present

## 2015-09-20 NOTE — Progress Notes (Signed)
Cardiology Office Note   Date:  09/20/2015   ID:  [name] [name], DOB December 14, 1933, MRN [id number]  PCP:  [name], MD  Cardiologist:   Dorris Carnes, MD   No chief complaint on file.  Pt is referred by R Tisovec for f/u of CAD     History of Present Illness: [name] [name] is a 82 y.o. male with a history of HTN and CAD   Followed by Dr Osborne Casco  PTCA in [place] seen by Dr Doreatha Lew about 10 year ago Used to see him yearly  Had stress tests in past  Last stress test 2007  Walked 12 min   Nuclar showed very small scar    Now breathing is good    Dances, shag, line dance.  Does ok    NO CP   Had CP in early 90s        Outpatient Prescriptions Prior to Visit  Medication Sig Dispense Refill  . aspirin 325 MG tablet Take 81 mg by mouth daily.     [name] atorvastatin (LIPITOR) 10 MG tablet Take 10 mg by mouth daily.    . cholecalciferol (VITAMIN D) 1000 UNITS tablet Take 2,000 Units by mouth daily.     . fish oil-omega-3 fatty acids 1000 MG capsule Take 2 g by mouth daily.    [name] losartan (COZAAR) 50 MG tablet Take 50 mg by mouth daily.    . Multiple Vitamin (MULTIVITAMIN) tablet Take 1 tablet by mouth daily.    . tadalafil (CIALIS) 5 MG tablet Take 5 mg by mouth daily as needed.    [name] UNABLE TO FIND Med Name: Vision Shield; Takes 1 capsule daily    . vitamin B-12 (CYANOCOBALAMIN) 1000 MCG tablet Take 1,000 mcg by mouth daily.     No facility-administered medications prior to visit.     Allergies:   Sulfa antibiotics   Past Medical History  Diagnosis Date  . Ischemic heart disease   . Hypertension     "white coat syndrome"  . Myocardial infarction ([place]) 1991    , angioplasty done.  denies s&s  . Benign prostatic hypertrophy (BPH) with incomplete bladder emptying   . Urethral stricture   . Bone spur   . Nocturia associated with benign prostatic hypertrophy   . Prostate cancer ([place]) 2010    seed implants  . Hyperlipidemia     Past Surgical History  Procedure  Laterality Date  . None    . Coronary angioplasty  1991  . Radioactive seed implant  2010  . Urethral dilation    . Cystoscopy with urethral dilatation  06/10/2012    Procedure: CYSTOSCOPY WITH URETHRAL DILATATION;  Surgeon: Bernestine Amass, MD;  Location: [facility];  Service: Urology;  Laterality: N/A;   BALLOON DILATION POSSIBLE LASER INCISION OF BLADDER NECK CONTRACTURE      Social History:  The patient  reports that he has never smoked. He has never used smokeless tobacco. He reports that he drinks about 1.2 oz of alcohol per week. He reports that he does not use illicit drugs.   Family History:  The patient's family history is negative for Colon cancer.    ROS:  Please see the history of present illness. All other systems are reviewed and  Negative to the above problem except as noted.    PHYSICAL EXAM: VS:  BP 154/84 mmHg  Pulse 66  Ht 5\' 7"  (1.702 m)  Wt 174 lb (78.926 kg)  BMI 27.25 kg/m2  GEN: Well nourished, well developed, in no acute distress HEENT: normal Neck: no JVD, carotid bruits, or masses Cardiac: RRR; no murmurs, rubs, or gallops,no edema  Respiratory:  clear to auscultation bilaterally, normal work of breathing GI: soft, nontender, nondistended, + BS  No hepatomegaly  MS: no deformity Moving all extremities   Skin: warm and dry, no rash Neuro:  Strength and sensation are intact Psych: euthymic mood, full affect   EKG:  EKG is ordered today.  SR 66 bpm     Lipid Panel No results found for: CHOL, TRIG, HDL, CHOLHDL, VLDL, LDLCALC, LDLDIRECT    Wt Readings from Last 3 Encounters:  09/20/15 174 lb (78.926 kg)  03/03/14 169 lb (76.658 kg)  02/20/14 169 lb 3.2 oz (76.749 kg)      ASSESSMENT AND PLAN:  1.  CAD  Appears to be doing OK But he says he is not as active as he used to be  Wonders if every thing ok  I would set up for stress myovue  2.  HTN  Needs to be followed A little high today  3  HL  Will get labs fro R  Tisovec  F/U in 1 year, sooner based on test results       Signed, Dorris Carnes, MD  09/20/2015 2:30 PM    [address], [place], [place]  [address] Phone: [phone]; Fax: [phone]

## 2015-09-20 NOTE — Telephone Encounter (Signed)
Patient given detailed instructions per Myocardial Perfusion Study Information Sheet for the test on 09/21/2015 at 7:45. Patient notified to arrive 15 minutes early and that it is imperative to arrive on time for appointment to keep from having the test rescheduled.  If you need to cancel or reschedule your appointment, please call the office within 24 hours of your appointment. Failure to do so may result in a cancellation of your appointment, and a $50 no show fee. Patient verbalized understanding.EHK

## 2015-09-20 NOTE — Patient Instructions (Signed)
Medication Instructions:  Your physician recommends that you continue on your current medications as directed. Please refer to the Current Medication list given to you today.   Labwork: We will request your recent lab work from your primary car physician  Testing/Procedures: Your physician has requested that you have en exercise stress myoview. For further information please visit [email or web address]. Please follow instruction sheet, as given.   Follow-Up: Your physician wants you to follow-up in: 1 year with Dr.Ross You will receive a reminder letter in the mail two months in advance. If you don't receive a letter, please call our office to schedule the follow-up appointment.   Any Other Special Instructions Will Be Listed Below (If Applicable).     If you need a refill on your cardiac medications before your next appointment, please call your pharmacy.

## 2015-09-21 ENCOUNTER — Ambulatory Visit (HOSPITAL_COMMUNITY): Payer: Medicare Other | Attending: Internal Medicine

## 2015-09-21 ENCOUNTER — Telehealth: Payer: Self-pay

## 2015-09-21 ENCOUNTER — Telehealth: Payer: Self-pay | Admitting: Internal Medicine

## 2015-09-21 DIAGNOSIS — I25119 Atherosclerotic heart disease of native coronary artery with unspecified angina pectoris: Secondary | ICD-10-CM | POA: Diagnosis present

## 2015-09-21 DIAGNOSIS — I10 Essential (primary) hypertension: Secondary | ICD-10-CM | POA: Diagnosis not present

## 2015-09-21 DIAGNOSIS — R0602 Shortness of breath: Secondary | ICD-10-CM | POA: Insufficient documentation

## 2015-09-21 DIAGNOSIS — R9439 Abnormal result of other cardiovascular function study: Secondary | ICD-10-CM | POA: Diagnosis not present

## 2015-09-21 LAB — MYOCARDIAL PERFUSION IMAGING
CHL CUP NUCLEAR SRS: 9
CHL CUP NUCLEAR SSS: 15
CSEPPHR: 117 {beats}/min
LHR: 0.31
LVDIAVOL: 109 mL (ref 62–150)
LVSYSVOL: 48 mL
NUC STRESS TID: 0.89
Rest HR: 54 {beats}/min
SDS: 6

## 2015-09-21 MED ORDER — REGADENOSON 0.4 MG/5ML IV SOLN
0.4000 mg | Freq: Once | INTRAVENOUS | Status: AC
  Administered 2015-09-21: 0.4 mg via INTRAVENOUS

## 2015-09-21 MED ORDER — TECHNETIUM TC 99M SESTAMIBI GENERIC - CARDIOLITE
10.5000 | Freq: Once | INTRAVENOUS | Status: AC | PRN
  Administered 2015-09-21: 11 via INTRAVENOUS

## 2015-09-21 MED ORDER — TECHNETIUM TC 99M SESTAMIBI GENERIC - CARDIOLITE
32.5000 | Freq: Once | INTRAVENOUS | Status: AC | PRN
  Administered 2015-09-21: 33 via INTRAVENOUS

## 2015-09-21 NOTE — Telephone Encounter (Signed)
Called Dr.Tisovec's office to rqst pt's recent lab results. lmom for Stacie in [place] to fax lab results to our office attn: Dr.Ross

## 2015-09-21 NOTE — Telephone Encounter (Signed)
Called pt to review myoview Normal perfusion BP increased signif during test Switched to Lexiscan  Pt went home   Did power walking through house  BP 150s to high 160s/ with walking    Would recomm adding 12.5 mg HCTZ to regimen   Have pt follow BP  Call with respnse

## 2015-09-22 ENCOUNTER — Encounter: Payer: Self-pay | Admitting: Internal Medicine

## 2015-09-22 ENCOUNTER — Telehealth: Payer: Self-pay | Admitting: *Deleted

## 2015-09-22 DIAGNOSIS — R030 Elevated blood-pressure reading, without diagnosis of hypertension: Principal | ICD-10-CM

## 2015-09-22 DIAGNOSIS — IMO0001 Reserved for inherently not codable concepts without codable children: Secondary | ICD-10-CM

## 2015-09-22 MED ORDER — HYDROCHLOROTHIAZIDE 12.5 MG PO CAPS: 12.5000 mg | ORAL_CAPSULE | Freq: Every day | ORAL | Status: DC

## 2015-09-22 NOTE — Telephone Encounter (Signed)
Call from pharmacist at [facility] [place]. Pt has sulfa allergy, there is a sulfa particle on the hctz, which could cause a reaction although it is unlikely. Pharmacy needs clarification that it is ok to fill.  Pharmacists states he spoke with the patient and patient states he had a rash about 30 years ago due to a sulfa antibiotic.    Advised to hold medication until we get clarification from Dr. Harrington Challenger that it is ok to fill.   Will call back once information is received from Dr. Harrington Challenger.

## 2015-09-22 NOTE — Telephone Encounter (Signed)
Spoke with patient's wife, hctz sent to [facility] [place]. He will begin taking in addition to losartan. Advised to check BP daily (several hours after meds) and call back with readings.  She will relay message to patient, he is out of town today.

## 2015-09-22 NOTE — Addendum Note (Signed)
Addended by: Rodman Key on: 09/22/2015 11:50 AM   Modules accepted: Orders

## 2015-09-23 NOTE — Telephone Encounter (Signed)
Noted now of sulfa allergy  WIll not fill HCTZ (change of cross reaction rel low) Would recomm increasing losartan to 100 mg per day F/U BMET in 2 wks Pt will need Rx for 100 mg  Pt will keep log of BPS   Pt has been notified

## 2015-09-24 MED ORDER — LOSARTAN POTASSIUM 100 MG PO TABS: 100.0000 mg | ORAL_TABLET | Freq: Every day | ORAL | Status: AC

## 2015-09-24 NOTE — Telephone Encounter (Signed)
Changed losartan to 100mg , [facility].

## 2015-10-07 ENCOUNTER — Other Ambulatory Visit (INDEPENDENT_AMBULATORY_CARE_PROVIDER_SITE_OTHER): Payer: Medicare Other | Admitting: *Deleted

## 2015-10-07 DIAGNOSIS — IMO0001 Reserved for inherently not codable concepts without codable children: Secondary | ICD-10-CM

## 2015-10-07 DIAGNOSIS — R030 Elevated blood-pressure reading, without diagnosis of hypertension: Secondary | ICD-10-CM

## 2015-10-07 LAB — BASIC METABOLIC PANEL
BUN: 12 mg/dL (ref 7–25)
CHLORIDE: 105 mmol/L (ref 98–110)
CO2: 28 mmol/L (ref 20–31)
CREATININE: 1.05 mg/dL (ref 0.70–1.11)
Calcium: 9.9 mg/dL (ref 8.6–10.3)
Glucose, Bld: 88 mg/dL (ref 65–99)
POTASSIUM: 4.7 mmol/L (ref 3.5–5.3)
Sodium: 141 mmol/L (ref 135–146)

## 2015-11-24 ENCOUNTER — Other Ambulatory Visit: Payer: Self-pay | Admitting: Urology

## 2015-11-26 DIAGNOSIS — H903 Sensorineural hearing loss, bilateral: Secondary | ICD-10-CM | POA: Insufficient documentation

## 2015-11-26 DIAGNOSIS — H6123 Impacted cerumen, bilateral: Secondary | ICD-10-CM | POA: Insufficient documentation

## 2015-12-03 ENCOUNTER — Encounter (HOSPITAL_BASED_OUTPATIENT_CLINIC_OR_DEPARTMENT_OTHER): Payer: Self-pay | Admitting: *Deleted

## 2015-12-09 ENCOUNTER — Encounter (HOSPITAL_BASED_OUTPATIENT_CLINIC_OR_DEPARTMENT_OTHER): Payer: Self-pay | Admitting: *Deleted

## 2015-12-09 NOTE — Progress Notes (Signed)
NPO AFTER MN.  ARRIVE AT 0915.  NEEDS ISTAT .  CURRENT EKG IN CHART AND EPIC.

## 2015-12-15 ENCOUNTER — Encounter (HOSPITAL_BASED_OUTPATIENT_CLINIC_OR_DEPARTMENT_OTHER): Payer: Self-pay | Admitting: *Deleted

## 2015-12-15 ENCOUNTER — Ambulatory Visit (HOSPITAL_BASED_OUTPATIENT_CLINIC_OR_DEPARTMENT_OTHER): Payer: Medicare Other | Admitting: Anesthesiology

## 2015-12-15 ENCOUNTER — Ambulatory Visit (HOSPITAL_BASED_OUTPATIENT_CLINIC_OR_DEPARTMENT_OTHER)
Admission: RE | Admit: 2015-12-15 | Discharge: 2015-12-15 | Disposition: A | Discharge status: Home / Self Care | Payer: Medicare Other | Source: Ambulatory Visit | Attending: Urology | Admitting: Urology

## 2015-12-15 ENCOUNTER — Encounter (HOSPITAL_BASED_OUTPATIENT_CLINIC_OR_DEPARTMENT_OTHER)
Admission: RE | Disposition: A | Discharge status: Home / Self Care | Payer: Self-pay | Source: Ambulatory Visit | Attending: Urology

## 2015-12-15 DIAGNOSIS — I2510 Atherosclerotic heart disease of native coronary artery without angina pectoris: Secondary | ICD-10-CM | POA: Insufficient documentation

## 2015-12-15 DIAGNOSIS — E291 Testicular hypofunction: Secondary | ICD-10-CM | POA: Insufficient documentation

## 2015-12-15 DIAGNOSIS — N320 Bladder-neck obstruction: Secondary | ICD-10-CM | POA: Insufficient documentation

## 2015-12-15 DIAGNOSIS — Z7982 Long term (current) use of aspirin: Secondary | ICD-10-CM | POA: Insufficient documentation

## 2015-12-15 DIAGNOSIS — Z87891 Personal history of nicotine dependence: Secondary | ICD-10-CM | POA: Insufficient documentation

## 2015-12-15 DIAGNOSIS — I252 Old myocardial infarction: Secondary | ICD-10-CM | POA: Diagnosis not present

## 2015-12-15 DIAGNOSIS — N35011 Post-traumatic bulbous urethral stricture: Secondary | ICD-10-CM | POA: Insufficient documentation

## 2015-12-15 DIAGNOSIS — I10 Essential (primary) hypertension: Secondary | ICD-10-CM | POA: Insufficient documentation

## 2015-12-15 DIAGNOSIS — Z8546 Personal history of malignant neoplasm of prostate: Secondary | ICD-10-CM | POA: Insufficient documentation

## 2015-12-15 DIAGNOSIS — N5201 Erectile dysfunction due to arterial insufficiency: Secondary | ICD-10-CM | POA: Diagnosis not present

## 2015-12-15 DIAGNOSIS — Z79899 Other long term (current) drug therapy: Secondary | ICD-10-CM | POA: Insufficient documentation

## 2015-12-15 DIAGNOSIS — Z08 Encounter for follow-up examination after completed treatment for malignant neoplasm: Secondary | ICD-10-CM | POA: Diagnosis present

## 2015-12-15 HISTORY — DX: Atherosclerotic heart disease of native coronary artery without angina pectoris: I25.10

## 2015-12-15 HISTORY — DX: Personal history of malignant neoplasm of prostate: Z85.46

## 2015-12-15 HISTORY — DX: Male erectile dysfunction, unspecified: N52.9

## 2015-12-15 HISTORY — DX: Retention of urine, unspecified: R33.9

## 2015-12-15 HISTORY — DX: Other obstructive and reflux uropathy: N13.8

## 2015-12-15 HISTORY — DX: Diverticulosis of large intestine without perforation or abscess without bleeding: K57.30

## 2015-12-15 HISTORY — DX: Old myocardial infarction: I25.2

## 2015-12-15 HISTORY — PX: CYSTOSCOPY WITH URETHRAL DILATATION: SHX5125

## 2015-12-15 HISTORY — DX: Testicular hypofunction: E29.1

## 2015-12-15 HISTORY — DX: Personal history of urinary (tract) infections: Z87.440

## 2015-12-15 HISTORY — DX: Presence of dental prosthetic device (complete) (partial): Z97.2

## 2015-12-15 HISTORY — DX: Presence of spectacles and contact lenses: Z97.3

## 2015-12-15 HISTORY — DX: Personal history of colonic polyps: Z86.010

## 2015-12-15 HISTORY — DX: Personal history of adenomatous and serrated colon polyps: Z86.0101

## 2015-12-15 HISTORY — DX: Benign prostatic hyperplasia with lower urinary tract symptoms: N40.1

## 2015-12-15 LAB — POCT I-STAT, CHEM 8
BUN: 14 mg/dL (ref 6–20)
Calcium, Ion: 1.36 mmol/L — ABNORMAL HIGH (ref 1.12–1.23)
Chloride: 103 mmol/L (ref 101–111)
Creatinine, Ser: 1 mg/dL (ref 0.61–1.24)
Glucose, Bld: 92 mg/dL (ref 65–99)
HEMATOCRIT: 50 % (ref 39.0–52.0)
HEMOGLOBIN: 17 g/dL (ref 13.0–17.0)
Potassium: 4.4 mmol/L (ref 3.5–5.1)
SODIUM: 141 mmol/L (ref 135–145)
TCO2: 29 mmol/L (ref 0–100)

## 2015-12-15 SURGERY — CYSTOSCOPY, WITH URETHRAL DILATION
Anesthesia: General | Site: Urethra

## 2015-12-15 MED ORDER — ONDANSETRON HCL 4 MG/2ML IJ SOLN
INTRAMUSCULAR | Status: DC | PRN
  Administered 2015-12-15: 4 mg via INTRAVENOUS

## 2015-12-15 MED ORDER — ONDANSETRON HCL 4 MG/2ML IJ SOLN
4.0000 mg | Freq: Once | INTRAMUSCULAR | Status: DC | PRN
  Filled 2015-12-15: qty 2

## 2015-12-15 MED ORDER — LIDOCAINE HCL 2 % EX GEL
CUTANEOUS | Status: DC | PRN
  Administered 2015-12-15: 1 via URETHRAL

## 2015-12-15 MED ORDER — FENTANYL CITRATE (PF) 100 MCG/2ML IJ SOLN
INTRAMUSCULAR | Status: AC
  Filled 2015-12-15: qty 2

## 2015-12-15 MED ORDER — LIDOCAINE HCL (CARDIAC) 20 MG/ML IV SOLN
INTRAVENOUS | Status: AC
  Filled 2015-12-15: qty 5

## 2015-12-15 MED ORDER — CEFAZOLIN SODIUM-DEXTROSE 2-4 GM/100ML-% IV SOLN
2.0000 g | INTRAVENOUS | Status: AC
  Administered 2015-12-15: 2 g via INTRAVENOUS
  Filled 2015-12-15: qty 100

## 2015-12-15 MED ORDER — FENTANYL CITRATE (PF) 100 MCG/2ML IJ SOLN
25.0000 ug | INTRAMUSCULAR | Status: DC | PRN
  Filled 2015-12-15: qty 1

## 2015-12-15 MED ORDER — DEXAMETHASONE SODIUM PHOSPHATE 10 MG/ML IJ SOLN
INTRAMUSCULAR | Status: DC | PRN
  Administered 2015-12-15: 5 mg via INTRAVENOUS

## 2015-12-15 MED ORDER — PROPOFOL 10 MG/ML IV BOLUS
INTRAVENOUS | Status: DC | PRN
  Administered 2015-12-15: 150 mg via INTRAVENOUS
  Administered 2015-12-15: 50 mg via INTRAVENOUS

## 2015-12-15 MED ORDER — CIPROFLOXACIN HCL 500 MG PO TABS: 500.0000 mg | ORAL_TABLET | Freq: Two times a day (BID) | ORAL | Status: AC

## 2015-12-15 MED ORDER — LIDOCAINE HCL (CARDIAC) 20 MG/ML IV SOLN
INTRAVENOUS | Status: DC | PRN
  Administered 2015-12-15: 60 mg via INTRAVENOUS

## 2015-12-15 MED ORDER — ONDANSETRON HCL 4 MG/2ML IJ SOLN
INTRAMUSCULAR | Status: AC
  Filled 2015-12-15: qty 2

## 2015-12-15 MED ORDER — DIATRIZOATE MEGLUMINE 30 % UR SOLN
URETHRAL | Status: DC | PRN
  Administered 2015-12-15: .1 mL via URETHRAL
  Administered 2015-12-15: 26 mL via URETHRAL

## 2015-12-15 MED ORDER — LACTATED RINGERS IV SOLN
INTRAVENOUS | Status: DC
  Administered 2015-12-15 (×2): via INTRAVENOUS
  Filled 2015-12-15: qty 1000

## 2015-12-15 MED ORDER — PROPOFOL 10 MG/ML IV BOLUS
INTRAVENOUS | Status: AC
  Filled 2015-12-15: qty 20

## 2015-12-15 MED ORDER — FENTANYL CITRATE (PF) 100 MCG/2ML IJ SOLN
INTRAMUSCULAR | Status: DC | PRN
  Administered 2015-12-15 (×2): 12.5 ug via INTRAVENOUS
  Administered 2015-12-15 (×2): 25 ug via INTRAVENOUS

## 2015-12-15 MED ORDER — CEFAZOLIN SODIUM-DEXTROSE 2-4 GM/100ML-% IV SOLN
INTRAVENOUS | Status: AC
  Filled 2015-12-15: qty 100

## 2015-12-15 MED ORDER — STERILE WATER FOR IRRIGATION IR SOLN
Status: DC | PRN
  Administered 2015-12-15: 3000 mL

## 2015-12-15 MED ORDER — HYDROCODONE-ACETAMINOPHEN 5-325 MG PO TABS: 1.0000 | ORAL_TABLET | Freq: Four times a day (QID) | ORAL | Status: DC | PRN

## 2015-12-15 SURGICAL SUPPLY — 29 items
BAG DRAIN URO-CYSTO SKYTR STRL (DRAIN) ×3 IMPLANT
BAG URINE LEG 500ML (DRAIN) ×3 IMPLANT
BALLN NEPHROSTOMY (BALLOONS) ×3
BALLOON NEPHROSTOMY (BALLOONS) ×1 IMPLANT
CATH FOLEY 2WAY SLVR  5CC 18FR (CATHETERS) ×2
CATH FOLEY 2WAY SLVR 5CC 18FR (CATHETERS) ×1 IMPLANT
CLOTH BEACON ORANGE TIMEOUT ST (SAFETY) ×3 IMPLANT
ELECT REM PT RETURN 9FT ADLT (ELECTROSURGICAL) ×3
ELECTRODE REM PT RTRN 9FT ADLT (ELECTROSURGICAL) ×1 IMPLANT
GLOVE BIO SURGEON STRL SZ7.5 (GLOVE) ×6 IMPLANT
GLOVE BIOGEL PI IND STRL 7.5 (GLOVE) ×2 IMPLANT
GLOVE BIOGEL PI INDICATOR 7.5 (GLOVE) ×4
GLOVE INDICATOR 7.5 STRL GRN (GLOVE) ×3 IMPLANT
GOWN STRL REUS W/ TWL XL LVL3 (GOWN DISPOSABLE) ×2 IMPLANT
GOWN STRL REUS W/TWL LRG LVL3 (GOWN DISPOSABLE) ×3 IMPLANT
GOWN STRL REUS W/TWL XL LVL3 (GOWN DISPOSABLE) ×7 IMPLANT
GUIDEWIRE STR DUAL SENSOR (WIRE) ×3 IMPLANT
HOLDER FOLEY CATH W/STRAP (MISCELLANEOUS) IMPLANT
KIT ROOM TURNOVER WOR (KITS) ×3 IMPLANT
MANIFOLD NEPTUNE II (INSTRUMENTS) ×3 IMPLANT
NDL SAFETY ECLIPSE 18X1.5 (NEEDLE) IMPLANT
NEEDLE HYPO 18GX1.5 SHARP (NEEDLE)
NEEDLE HYPO 22GX1.5 SAFETY (NEEDLE) IMPLANT
PACK CYSTO (CUSTOM PROCEDURE TRAY) ×3 IMPLANT
SYR 20CC LL (SYRINGE) IMPLANT
SYR 30ML LL (SYRINGE) IMPLANT
TUBE CONNECTING 12'X1/4 (SUCTIONS) ×1
TUBE CONNECTING 12X1/4 (SUCTIONS) ×2 IMPLANT
WATER STERILE IRR 3000ML UROMA (IV SOLUTION) ×3 IMPLANT

## 2015-12-15 NOTE — Transfer of Care (Signed)
Immediate Anesthesia Transfer of Care Note  Patient: [name] [name]  Procedure(s) Performed: Procedure(s) (LRB): CYSTOSCOPY WITH URETHRA L BALLOON  DILATATION (N/A)  Patient Location: PACU  Anesthesia Type: General  Level of Consciousness: awake, sedated, patient cooperative and responds to stimulation  Airway & Oxygen Therapy: Patient Spontanous Breathing and Patient connected to face mask oxygen  Post-op Assessment: Report given to PACU RN, Post -op Vital signs reviewed and stable and Patient moving all extremities  Post vital signs: Reviewed and stable  Complications: No apparent anesthesia complications

## 2015-12-15 NOTE — Op Note (Signed)
Operative Note:  Preoperative Diagnosis: bladder neck contracture  Postoperative Diagnosis:  same  Procedure(s) Performed:   1) Cystourethroscopy 2) Balloon dilation of bladder neck contracture 3) Simple foley catheter placement  Teaching Surgeon:  Rana Snare, MD  Resident Surgeon:  Virginia Crews, MD  Assistant(s):  none  Anesthesia:  General via LMA  Fluids:  See anesthesia record  Estimated blood loss:  2 mL  Specimens:  none  Cultures:  none  Drains:  18Fr foley catheter  Complications:  none  Indications: 82 yo male with history of prostate cancer s/p brachytherapy and greenlight TURP now with recurrence bladder neck contracture.  Findings:   1) Bladder neck contracture measuring approximately 8Fr in diameter seen 2) Successful dilation of contracture to 24Fr with no masses, lesions, stones seen in bladder on cystoscopy. Significant trabeculation of the bladder noted.  Description:  The patient was correctly identified in the preop holding area where written informed consent as well potential risk and complication reviewed. He agreed. They were brought back to the operative suite where a preinduction timeout was performed. Once correct information was verified, general anesthesia was induced via endotracheal tube. They were then gently placed into dorsal lithotomy position with SCDs in place for VTE prophylaxis. They were prepped and draped in the usual sterile fashion and given appropriate preoperative antibiotics with Ancef. A second timeout was then performed.   We inserted a 30F rigid cystoscope per urethra with copious lubrication and normal saline irrigation running. This demonstrated the bladder neck contracture as above with inability to advance the cystoscope into the bladder. We then inserted a sensor wire into his bladder under direct visualization and fluoroscopy. The cystoscope was removed and a balloon dilator was passed over the across the area of stricture.  We performed balloon dilation up to 24Fr and allowed this to remain for 5 minutes. We then deflated the balloon and removed the dilator. Repeat cystoscopy demonstrated a patent bladder neck with no masses or lesions seen in the bladder. The sensor wire was removed and lidocaine jelly was inserted per urethra. We then inserted an 18Fr foley catheter with 29ml of sterile water placed in the balloon. This was placed to straight drainage.   Post Op Plan:   1. Discharge patient home when meets PACU criteria. 2. Plan for foley removal Friday in clinic  Attestation:  Dr. Risa Grill was present and scrubbed for the entirety of the procedure.

## 2015-12-15 NOTE — Anesthesia Preprocedure Evaluation (Addendum)
Anesthesia Evaluation  Patient identified by MRN, date of birth, ID band Patient awake    Reviewed: Allergy & Precautions, H&P , NPO status , Patient's Chart, lab work & pertinent test results  Airway Mallampati: I  TM Distance: >3 FB Neck ROM: Full    Dental  (+) Dental Advisory Given, Partial Upper, Partial Lower   Pulmonary neg pulmonary ROS, former smoker,    Pulmonary exam normal breath sounds clear to auscultation       Cardiovascular hypertension, Pt. on medications + CAD and + Past MI  Normal cardiovascular exam Rhythm:Regular Rate:Normal  09/2015 Stress test: Subtle fixed apical defect. Normal ejection fraction 56% with no wall motion abnormality. No ischemia. Overall low risk study.   Neuro/Psych negative neurological ROS  negative psych ROS   GI/Hepatic negative GI ROS, Neg liver ROS,   Endo/Other  negative endocrine ROS  Renal/GU negative Renal ROS     Musculoskeletal negative musculoskeletal ROS (+)   Abdominal   Peds  Hematology negative hematology ROS (+)   Anesthesia Other Findings   Reproductive/Obstetrics                            Anesthesia Physical  Anesthesia Plan  ASA: III  Anesthesia Plan: General   Post-op Pain Management:    Induction: Intravenous  Airway Management Planned: LMA  Additional Equipment:   Intra-op Plan:   Post-operative Plan: Extubation in OR  Informed Consent: I have reviewed the patients History and Physical, chart, labs and discussed the procedure including the risks, benefits and alternatives for the proposed anesthesia with the patient or authorized representative who has indicated his/her understanding and acceptance.   Dental advisory given  Plan Discussed with: CRNA  Anesthesia Plan Comments:         Anesthesia Quick Evaluation

## 2015-12-15 NOTE — Anesthesia Postprocedure Evaluation (Signed)
Anesthesia Post Note  Patient: [name] [name]  Procedure(s) Performed: Procedure(s) (LRB): CYSTOSCOPY WITH URETHRA L BALLOON  DILATATION (N/A)  Patient location during evaluation: PACU Anesthesia Type: General Level of consciousness: awake and alert Pain management: pain level controlled Vital Signs Assessment: post-procedure vital signs reviewed and stable Respiratory status: spontaneous breathing, nonlabored ventilation, respiratory function stable and patient connected to nasal cannula oxygen Cardiovascular status: blood pressure returned to baseline and stable Postop Assessment: no signs of nausea or vomiting Anesthetic complications: no    Last Vitals:  Filed Vitals:   12/15/15 1142 12/15/15 1145  BP: 146/79 140/89  Pulse: 62 52  Temp: 36.4 C   Resp: 13 9    Last Pain:  Filed Vitals:   12/15/15 1150  PainSc: Asleep                 Zenaida Deed

## 2015-12-15 NOTE — H&P (Signed)
Office Visit Report     11/22/2015   --------------------------------------------------------------------------------   [name] [name]  MRN: [id number]  PRIMARY CARE:  [name], MD  DOB: [birth year], 82 year old Male  REFERRING:  [name] Diener, MD  SSN: [id number]  PROVIDER:  Rana Snare, M.D.    LOCATION:  [facility] [phone]   --------------------------------------------------------------------------------   CC/HPI: Here for cancer follow up    CC/HPI: Mr. [name] [name] today for follow-up.His past nodes while lying somewhat complicated history. He is felt to have significant recurrent stricture and continues to have a very weak urinary stream with hesitancy and incomplete bladder emptying. Postvoid residual today is better at around 6 ounces. Recent PSA was undetectable and from a cancer standpoint continued to be very encouraging. It was all urethral dilation on his last visit due to some issues with his wife but he has not now to pursue this. Recent testosterone rechecked was well over 600.     ALLERGIES: Sulfa Drugs    MEDICATIONS: Androgel 20.25 mg/1.25 gram per actuation (1.62 %) gel in metered-dose pump 2 pumps PO Daily  Aspirin 81 mg tablet, chewable  Cialis 20 mg tablet  Atorvastatin Calcium  Fish Oil  Losartan Potassium 50 mg tablet  Multivitamin  Preservision Areds  Vitamin B Complex  Vitamin D3     GU PSH: Cysto Dilate Stricture (M or F) - 2014 Cysto Remove Stent FB Sim - 2010 TRANSPERI NEEDLE PLACE, PROS - 2010      PSH Notes: Cystoscopy For Urethral Stricture, Cystoscopy With Removal Of Object, Surgery Prostate Transperineal Placement Of Needles, Heart Surgery   NON-GU PSH: None   GU PMH: Bladder Outlet Obstruction, Bladder neck contracture - 03/19/2015 ED, arterial insufficiency, Erectile dysfunction due to arterial insufficiency - 03/19/2015 History of prostate cancer, History of prostate cancer - 03/19/2015 Testicular  hypofunction, Hypogonadism, testicular - 03/19/2015 Urinary Retention, Unspec, Incomplete bladder emptying - 03/19/2015 Prostate Cancer, Prostate cancer - 02/13/2014 BPH w/LUTS, Benign prostatic hyperplasia with urinary obstruction - 2015 Atrophy of testis, Testicular atrophy - 2014 Elevated PSA, Elevated prostate specific antigen (PSA) - 2014 Overactive bladder, Overactive bladder - 2014 Recurrent Cystitis w/o hematuria, Chronic cystitis - 2014      PMH Notes: Past urologic history:    My first visit with Earle was in December of 2012. The patient had been under the care of Dr. Hessie [name]. The patient was diagnosed with what appeared to be favorable to intermediate risk prostate cancer in 2009. The patient had low volume Gleason 6 versus Gleason 7 depending on which pathologist reviewed his slides. He subsequently had a seed implantation done in February of 2010. That was complicated by some urinary retention which required several months of self catheterization. The patient subsequently decided to seek treatment in [place] and underwent a GreenLight laser of an apparent middle lobe of his prostate. The patient was able to subsequently void.    When I saw him in August 2013 he was having moderate to significant voiding complaints and had a recent episode of acute cystitis. He had a postvoid residual 160 cc at that time we recommended an alpha-blocker followed by a followup cystoscopy. He canceled that appointment he did not take the alpha-blocker. The patient subsequently came in and saw one of our nurse practitioners with really what sounds like almost an overflow situation. Postvoid residual was over 600 cc the patient refused a catheter. He does tell me he has been doing in and out catheterizations but it's  unclear to me if that's really true or not. He has started taking the alpha-blocker and feels like it's May things a little better but not dramatically so.    He is S/P a cystoscopy  and balloon dilation of bladder neck contracture performed in early January 2014. PSA 06/2011 at hospital was 0.15.    Repeat PSA in January 2015 was 0.05.   PSI March 2016 equals 0.04     NON-GU PMH: Encounter for general adult medical examination without abnormal findings, Encounter for preventive health examination - 2015 Personal history of other diseases of the circulatory system, History of cardiac disorder - 2014 , Acute Myocardial Infarction - 2008    FAMILY HISTORY: Cancer - Brother, Sister Death In The Family Father - Father Death In The Family Mother - Mother Lung Cancer - Father Prostate Cancer - Brother, Runs In Family   SOCIAL HISTORY: Marital Status: Married Patient does not smoke anymore.  Has not smoked since 11/03/1965.  Types of alcohol consumed: Beer, Wine. Light Drinker.  Drinks 2 caffeinated drinks per day. Patient's occupation is/was Retired.     Notes: Former smoker, Tobacco Use, Caffeine Use, Alcohol Use, Marital History - Currently Married   REVIEW OF SYSTEMS:    GU Review Male:   Patient reports have to strain to urinate and erection problems. Patient denies frequent urination, hard to postpone urination, burning/ pain with urination, get up at night to urinate, leakage of urine, stream starts and stops, trouble starting your stream, and penile pain.  Gastrointestinal (Upper):   Patient denies nausea, vomiting, and indigestion/ heartburn.  Gastrointestinal (Lower):   Patient denies diarrhea and constipation.  Constitutional:   Patient denies fever, night sweats, weight loss, and fatigue.  Skin:   Patient denies skin rash/ lesion and itching.  Eyes:   Patient denies blurred vision and double vision.  Ears/ Nose/ Throat:   Patient denies sore throat and sinus problems.  Hematologic/Lymphatic:   Patient denies swollen glands and easy bruising.  Cardiovascular:   Patient denies leg swelling and chest pains.  Respiratory:   Patient denies cough and shortness  of breath.  Endocrine:   Patient denies excessive thirst.  Musculoskeletal:   Patient denies back pain and joint pain.  Neurological:   Patient denies headaches and dizziness.  Psychologic:   Patient denies depression and anxiety.   VITAL SIGNS:    BP: 155/90 mmHg   Pulse: 65 /min   Temp: 99.1 F / 37 C      MULTI-SYSTEM PHYSICAL EXAMINATION:    Constitutional: Well-nourished. No physical deformities. Normally developed. Good grooming.  Neck: Neck symmetrical, not swollen. Normal tracheal position.  Respiratory: No labored breathing, no use of accessory muscles.   Cardiovascular: Normal temperature, normal extremity pulses, no swelling, no varicosities.  Neurologic / Psychiatric: Oriented to time, oriented to place, oriented to person. No depression, no anxiety, no agitation.  Musculoskeletal: Normal gait and station of head and neck.   PAST DATA REVIEWED:  Source Of History:  Patient   11/15/15 11/15/15 11/21/07 11/21/07  PSA  Free PSA   0  0   Total PSA < 0.09 ng/dl 0.08 ng/dl    % Free PSA   0  0     11/15/15 11/15/15  Hormones  Testosterone, Total 616.9 pg/dL 616.9 pg/dL    PROCEDURES:           PVR Ultrasound - [id number]  Scanned Volume: 179 cc         Urinalysis w/Scope -  81001 Dipstick Dipstick Cont'd Micro  Specimen: Voided Bilirubin: Neg WBC/hpf: 0-5/hpf  Color: Yellow Ketones: Neg RBC/hpf: 0-2/hpf  Appearance: Clear Blood: Trace Intact Bacteria: NS (Not Seen)  Specific Gravity: <= 1.005 Protein: Neg Cystals: NS (Not Seen)  pH: 6.0 Urobilinogen: 0.2 Casts: NS (Not Seen)  Glucose: Neg Nitrites: Neg Trichomonas: Not Present    Leukocyte Esterase: Neg Mucous: Not Present      Epithelial Cells: 0-5/hpf      Yeast: NS (Not Seen)      Sperm: Not Present    ASSESSMENT:      ICD-10 Details  1 GU:   Bladder Outlet Obstruction - N32.0   2   History of prostate cancer - Z85.46   3   Post-traumatic bulbous urethral stricture - N35.011   4   Testicular hypofunction -  E29.1    PLAN:           Schedule Return Visit: 1 Month - PTNS, Schedule Surgery          Document Letter(s):  Created for Patient: Clinical Summary         Notes:   Continue to look encouraging from a cancer perspective. He continues to have fairly severe symptoms secondary to urethral stricture. We collectively decided to go ahead and perform a repeat urethral dilation with a balloon catheter. We can talk about the possibility of occasional and out catheterizations to reduce recurrence rates. He will continue on testosterone supplementation.   cc: Domenick Gong, MD    Signed by Rana Snare, M.D. on 11/22/15 at 2:03 PM (EDT)    The information contained in this medical record document is considered private and confidential patient information. This information can only be used for the medical diagnosis and/or medical services that are being provided by the patient's selected caregivers. This information can only be distributed outside of the patient's care if the patient agrees and signs waivers of authorization for this information to be sent to an outside source or route.

## 2015-12-15 NOTE — Discharge Instructions (Signed)

## 2015-12-15 NOTE — Interval H&P Note (Signed)
History and Physical Interval Note:  12/15/2015 11:02 AM  [name] [name]  has presented today for surgery, with the diagnosis of STRICTURE  The various methods of treatment have been discussed with the patient and family. After consideration of risks, benefits and other options for treatment, the patient has consented to  Procedure(s): CYSTOSCOPY WITH URETHRA L BALLOON  DILATATION (N/A) as a surgical intervention .  The patient's history has been reviewed, patient examined, no change in status, stable for surgery.  I have reviewed the patient's chart and labs.  Questions were answered to the patient's satisfaction.     Kolby Myung S

## 2015-12-15 NOTE — Anesthesia Procedure Notes (Signed)
Procedure Name: LMA Insertion Date/Time: 12/15/2015 11:07 AM Performed by: Justice Rocher Pre-anesthesia Checklist: Patient identified, Emergency Drugs available, Suction available and Patient being monitored Patient Re-evaluated:Patient Re-evaluated prior to inductionOxygen Delivery Method: Circle system utilized Preoxygenation: Pre-oxygenation with 100% oxygen Intubation Type: IV induction Ventilation: Mask ventilation without difficulty LMA: LMA inserted LMA Size: 4.0 Number of attempts: 1 Airway Equipment and Method: Bite block Placement Confirmation: positive ETCO2 Tube secured with: Tape Dental Injury: Teeth and Oropharynx as per pre-operative assessment

## 2015-12-17 ENCOUNTER — Encounter (HOSPITAL_BASED_OUTPATIENT_CLINIC_OR_DEPARTMENT_OTHER): Payer: Self-pay | Admitting: Urology

## 2017-02-12 ENCOUNTER — Encounter: Payer: Self-pay | Admitting: Internal Medicine

## 2017-02-12 ENCOUNTER — Encounter (INDEPENDENT_AMBULATORY_CARE_PROVIDER_SITE_OTHER): Payer: Self-pay

## 2017-02-12 ENCOUNTER — Ambulatory Visit (INDEPENDENT_AMBULATORY_CARE_PROVIDER_SITE_OTHER): Payer: Medicare Other | Admitting: Internal Medicine

## 2017-02-12 VITALS — BP 136/84 | HR 60 | Ht 68.0 in | Wt 173.8 lb

## 2017-02-12 DIAGNOSIS — I2510 Atherosclerotic heart disease of native coronary artery without angina pectoris: Secondary | ICD-10-CM

## 2017-02-12 DIAGNOSIS — E785 Hyperlipidemia, unspecified: Secondary | ICD-10-CM | POA: Diagnosis not present

## 2017-02-12 DIAGNOSIS — I10 Essential (primary) hypertension: Secondary | ICD-10-CM

## 2017-02-12 NOTE — Patient Instructions (Signed)
Your physician recommends that you continue on your current medications as directed. Please refer to the Current Medication list given to you today.  Your physician wants you to follow-up in: 1 year with Dr. Harrington Challenger.  You will receive a reminder letter in the mail two months in advance. If you don't receive a letter, please call our office to schedule the follow-up appointment.   Called PCP office, medical records will fax recent labs to [phone].

## 2017-02-12 NOTE — Progress Notes (Signed)
Cardiology Office Note   Date:  02/12/2017   ID:  [name] [name], DOB 1933-09-23, MRN [id number]  PCP:  [name], MD  Cardiologist:   Dorris Carnes, MD   Pt presents for f/u of CAD      History of Present Illness: [name] [name] is a 83 y.o. male with a history of HTN and CAD   Followed by Dr Osborne Casco  PTCA in [place] seen by Dr Doreatha Lew about 10 year ago Used to see him yearly  Had stress tests in past  Last stress test 2007  Walked 12 min   Nuclar showed very small scar    I saw the pt in 2017   Since seen he has done well  Continues to stay very acitve   Works out daily  [employer] for 3 hours at at time   Myovue last year showed no ischemia  LVEF 56 %   No CP   NO SOB  No dizziness    Outpatient Medications Prior to Visit  Medication Sig Dispense Refill  . ANDROGEL PUMP 20.25 MG/ACT (1.62%) GEL daily. 2 pumps daily    . aspirin EC 81 MG tablet Take 81 mg by mouth 2 (two) times daily.     [name] atorvastatin (LIPITOR) 10 MG tablet Take 10 mg by mouth every evening.     . fish oil-omega-3 fatty acids 1000 MG capsule Take 1 g by mouth daily.     [name] losartan (COZAAR) 100 MG tablet Take 1 tablet (100 mg total) by mouth daily. (Patient taking differently: Take 100 mg by mouth every evening. ) 90 tablet 3  . Multiple Vitamin (MULTIVITAMIN) tablet Take 1 tablet by mouth daily.    . tadalafil (CIALIS) 5 MG tablet Take 5 mg by mouth every evening.     [name] UNABLE TO FIND Med Name: Vision Shield; Takes 1 capsule daily    . vitamin B-12 (CYANOCOBALAMIN) 1000 MCG tablet Take 1,000 mcg by mouth daily.    . cholecalciferol (VITAMIN D) 1000 UNITS tablet Take 1,000 Units by mouth daily.     [name] HYDROcodone-acetaminophen (NORCO) 5-325 MG tablet Take 1 tablet by mouth every 6 (six) hours as needed for moderate pain. (Patient not taking: Reported on 02/12/2017) 10 tablet 0   No facility-administered medications prior to visit.      Allergies:   Sulfa antibiotics and Sulfamethoxazole   Past  Medical History:  Diagnosis Date  . BPH with obstruction/lower urinary tract symptoms   . Coronary artery disease    CARDIOLOGIST-  DR Jax Kentner  . Diverticulosis of colon   . ED (erectile dysfunction)   . History of adenomatous polyp of colon    tubular adenom2005;  2008;  2015  . History of MI (myocardial infarction)    1991 s/p angioplasty  . History of prostate cancer urologist-  dr Risa Grill--  last PSA  0.09 (10/ 2016)   dx 2009--  s/p  radioactive seed implants 06-10-2008  . History of recurrent cystitis   . Hyperlipidemia   . Hypertension   . Hypogonadism, testicular   . Incomplete emptying of bladder   . Urethral stricture   . Urethral stricture   . Wears contact lenses   . Wears partial dentures    upper    Past Surgical History:  Procedure Laterality Date  . CARDIOVASCULAR STRESS TEST  09-21-2015   dr Dorris Carnes   Low risk nuclear study w/ small defect of mild severity in apex ,  no ischemia /  normal LV function and wall motion , ef 56%  . CORONARY ANGIOPLASTY  1991  . CYSTO/  REMOVAL OF OBJECT    . CYSTOSCOPY WITH URETHRAL DILATATION  06/10/2012   Procedure: CYSTOSCOPY WITH URETHRAL DILATATION;  Surgeon: Bernestine Amass, MD;  Location: [facility];  Service: Urology;  Laterality: N/A;  . CYSTOSCOPY WITH URETHRAL DILATATION N/A 12/15/2015   Procedure: CYSTOSCOPY WITH URETHRA L BALLOON  DILATATION;  Surgeon: Rana Snare, MD;  Location: [facility];  Service: Urology;  Laterality: N/A;  . GREEN LIGHT LASER TURP (TRANSURETHRAL RESECTION OF PROSTATE  2012 approx.  [name] RADIOACTIVE PROSTATE SEED IMPLANTS  06-10-2008     Social History:  The patient  reports that he quit smoking about 51 years ago. His smoking use included Cigarettes. He has a 8.00 pack-year smoking history. He has never used smokeless tobacco. He reports that he drinks about 1.2 oz of alcohol per week . He reports that he does not use drugs.   Family History:  The patient's family  history includes Cancer in his unknown relative; Heart disease in his unknown relative; Hypertension in his unknown relative.    ROS:  Please see the history of present illness. All other systems are reviewed and  Negative to the above problem except as noted.    PHYSICAL EXAM: VS:  BP 136/84   Pulse 60   Ht 5\' 8"  (1.727 m)   Wt 173 lb 12.8 oz (78.8 kg)   BMI 26.43 kg/m   GEN: Well nourished, well developed, in no acute distress HEENT: normal Neck: no JVD, carotid bruits, or masses Cardiac: RRR; no murmurs, rubs, or gallops,no edema  Respiratory:  clear to auscultation bilaterally, normal work of breathing GI: soft, nontender, nondistended, + BS  No hepatomegaly  MS: no deformity Moving all extremities   Skin: warm and dry, no rash Neuro:  Strength and sensation are intact Psych: euthymic mood, full affect   EKG:  EKG is ordered today.  SR 60 bpm     Lipid Panel No results found for: CHOL, TRIG, HDL, CHOLHDL, VLDL, LDLCALC, LDLDIRECT    Wt Readings from Last 3 Encounters:  02/12/17 173 lb 12.8 oz (78.8 kg)  12/15/15 169 lb (76.7 kg)  09/21/15 174 lb (78.9 kg)      ASSESSMENT AND PLAN:  1.  CAD  Doing very good  Active  No ANgina 2.  HTN  BP good at home   Not too bad here   Complains of a ltitle fatuge   I do not think related to cozaar  3  HL Get lipids from medicine clinic    F/U in 1 year, sooner based on test results       Signed, Dorris Carnes, MD  02/12/2017 9:45 AM    [address], [place], [place]  [address] Phone: [phone]; Fax: [phone]

## 2017-02-19 ENCOUNTER — Telehealth: Payer: Self-pay | Admitting: Internal Medicine

## 2017-02-19 NOTE — Telephone Encounter (Signed)
Mr. Frimpong is returning a call . Please call

## 2017-02-19 NOTE — Telephone Encounter (Signed)
Returned call for lab results.  

## 2017-02-28 ENCOUNTER — Other Ambulatory Visit: Payer: Self-pay | Admitting: *Deleted

## 2017-02-28 DIAGNOSIS — E785 Hyperlipidemia, unspecified: Secondary | ICD-10-CM

## 2017-02-28 MED ORDER — ATORVASTATIN CALCIUM 20 MG PO TABS: 20.0000 mg | ORAL_TABLET | Freq: Every day | ORAL | 3 refills | Status: DC

## 2017-04-25 ENCOUNTER — Other Ambulatory Visit: Payer: Medicare Other | Admitting: *Deleted

## 2017-04-25 DIAGNOSIS — E785 Hyperlipidemia, unspecified: Secondary | ICD-10-CM

## 2017-04-25 LAB — LIPID PANEL
Chol/HDL Ratio: 2.5 ratio (ref 0.0–5.0)
Cholesterol, Total: 139 mg/dL (ref 100–199)
HDL: 56 mg/dL (ref >39)
LDL Calculated: 60 mg/dL (ref 0–99)
Triglycerides: 117 mg/dL (ref 0–149)
VLDL Cholesterol Cal: 23 mg/dL (ref 5–40)

## 2017-08-13 DIAGNOSIS — H409 Unspecified glaucoma: Secondary | ICD-10-CM | POA: Insufficient documentation

## 2017-10-04 IMAGING — NM NM MISC PROCEDURE
3 series · 18 of 18 positions shown · non-contrast
Comparison: none

[Series 1: wbr_s-proj_st stress_(id)_sa · 6.5mm · 6.51mm/px · 6 of 64 frames shown (1 of 2)]
[frame 6/64]
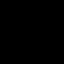
[frame 16/64]
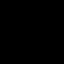
[frame 27/64]
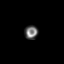
[frame 38/64]
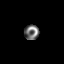
[frame 48/64]
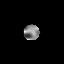
[frame 59/64]
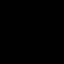

[Series 1: wbr_r-proj_st rest_(id)_sa · 6.5mm · 6.51mm/px · 6 of 64 frames shown]
[frame 6/64]
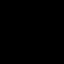
[frame 16/64]
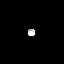
[frame 27/64]
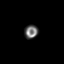
[frame 38/64]
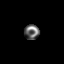
[frame 48/64]
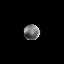
[frame 59/64]
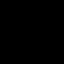

[Series 1: wbr_s-proj_st stress_(id)_sa · 6.5mm · 6.51mm/px · 6 of 512 frames shown (2 of 2)]
[frame 43/512]
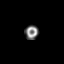
[frame 128/512]
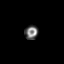
[frame 214/512]
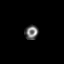
[frame 299/512]
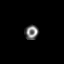
[frame 384/512]
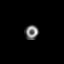
[frame 470/512]
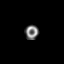

[18 of 18 positions shown; findings below may reference images not displayed]

Canned report from images found in remote index.

Refer to host system for actual result text.

## 2018-03-28 ENCOUNTER — Other Ambulatory Visit: Payer: Self-pay | Admitting: Internal Medicine

## 2018-04-22 ENCOUNTER — Other Ambulatory Visit: Payer: Self-pay | Admitting: Internal Medicine

## 2018-05-30 ENCOUNTER — Encounter: Payer: Self-pay | Admitting: Cardiology

## 2018-05-30 DIAGNOSIS — I10 Essential (primary) hypertension: Secondary | ICD-10-CM | POA: Insufficient documentation

## 2018-05-30 NOTE — Progress Notes (Signed)
Cardiology Office Note:    Date:  05/31/2018   ID:  [name] [name], DOB 1933/11/04, MRN [id number]  PCP:  [name], MD  Cardiologist:  Dorris Carnes, MD  Referring MD: [name], MD   Chief Complaint  Patient presents with  . Follow-up    CAD, Hypertension    History of Present Illness:    [name] [name] is a 84 y.o. male with a past medical history significant for hypertension, hyperlipidemia and CAD s/p PTCA 1991.  Most recent nuclear stress test in 09/2015 showed a small fixed apical defect, felt to be low risk study.  [name] [name] is here today for annual follow up and medication management. He works out a a gym Monday and Friday. He is in a dancing class on Wednesdays. He feels like he gets a lot of cardio exercise. He keeps up with much younger dance partenes.  No exertional chest pain/pressue tightness, shortness of breath. No palpitations, dizziness, syncope, orthopnea, PND or edema.   He eats a healthy diet, nuts, fruits, vegetables.   Home BP 130's/70's. HR low 60's.   Past Medical History:  Diagnosis Date  . BPH with obstruction/lower urinary tract symptoms   . Coronary artery disease    CARDIOLOGIST-  DR PAULA ROSS  . Diverticulosis of colon   . ED (erectile dysfunction)   . History of adenomatous polyp of colon    tubular adenom2005;  2008;  2015  . History of MI (myocardial infarction)    1991 s/p angioplasty  . History of prostate cancer urologist-  dr Risa Grill--  last PSA  0.09 (10/ 2016)   dx 2009--  s/p  radioactive seed implants 06-10-2008  . History of recurrent cystitis   . Hyperlipidemia   . Hypertension   . Hypogonadism, testicular   . Incomplete emptying of bladder   . Urethral stricture   . Urethral stricture   . Wears contact lenses   . Wears partial dentures    upper    Past Surgical History:  Procedure Laterality Date  . CARDIOVASCULAR STRESS TEST  09-21-2015   dr Dorris Carnes   Low risk nuclear study w/ small defect of  mild severity in apex , no ischemia /  normal LV function and wall motion , ef 56%  . CORONARY ANGIOPLASTY  1991  . CYSTO/  REMOVAL OF OBJECT    . CYSTOSCOPY WITH URETHRAL DILATATION  06/10/2012   Procedure: CYSTOSCOPY WITH URETHRAL DILATATION;  Surgeon: Bernestine Amass, MD;  Location: [facility];  Service: Urology;  Laterality: N/A;  . CYSTOSCOPY WITH URETHRAL DILATATION N/A 12/15/2015   Procedure: CYSTOSCOPY WITH URETHRA L BALLOON  DILATATION;  Surgeon: Rana Snare, MD;  Location: [facility];  Service: Urology;  Laterality: N/A;  . GREEN LIGHT LASER TURP (TRANSURETHRAL RESECTION OF PROSTATE  2012 approx.  [name] RADIOACTIVE PROSTATE SEED IMPLANTS  06-10-2008    Current Medications: Current Meds  Medication Sig  . ANDROGEL PUMP 20.25 MG/ACT (1.62%) GEL daily. 2 pumps daily  . aspirin EC 81 MG tablet Take 81 mg by mouth 2 (two) times daily.   [name] atorvastatin (LIPITOR) 20 MG tablet Take 1 tablet (20 mg total) by mouth daily at 6 PM.  . Cholecalciferol (VITAMIN D-1000 MAX ST) 25 MCG (1000 UT) tablet Take 1 tablet by mouth daily.  . fish oil-omega-3 fatty acids 1000 MG capsule Take 1 g by mouth daily.   [name] losartan (COZAAR) 100 MG tablet Take 1 tablet (  100 mg total) by mouth daily.  . Multiple Vitamin (MULTIVITAMIN) tablet Take 1 tablet by mouth daily.  . tadalafil (CIALIS) 5 MG tablet Take 5 mg by mouth every evening.   . tamsulosin (FLOMAX) 0.4 MG CAPS capsule Take 0.4 mg by mouth daily.  . vitamin B-12 (CYANOCOBALAMIN) 1000 MCG tablet Take 1,000 mcg by mouth daily.  . [DISCONTINUED] atorvastatin (LIPITOR) 20 MG tablet Take 1 tablet (20 mg total) by mouth daily at 6 PM. Please make overdue appt with Dr. Harrington Challenger before anymore refills. 2nd attempt     Allergies:   Sulfa antibiotics; Sulfasalazine; and Sulfamethoxazole   Social History   Socioeconomic History  . Marital status: Married    Spouse name: Not on file  . Number of children: 2  . Years of education: Not  on file  . Highest education level: Not on file  Occupational History  . Not on file  Social Needs  . Financial resource strain: Not on file  . Food insecurity:    Worry: Not on file    Inability: Not on file  . Transportation needs:    Medical: Not on file    Non-medical: Not on file  Tobacco Use  . Smoking status: Former Smoker    Packs/day: 1.00    Years: 8.00    Pack years: 8.00    Types: Cigarettes    Last attempt to quit: 12/08/1965    Years since quitting: 52.5  . Smokeless tobacco: Never Used  Substance and Sexual Activity  . Alcohol use: Yes    Alcohol/week: 2.0 standard drinks    Types: 2 Cans of beer per week  . Drug use: No  . Sexual activity: Not on file  Lifestyle  . Physical activity:    Days per week: Not on file    Minutes per session: Not on file  . Stress: Not on file  Relationships  . Social connections:    Talks on phone: Not on file    Gets together: Not on file    Attends religious service: Not on file    Active member of club or organization: Not on file    Attends meetings of clubs or organizations: Not on file    Relationship status: Not on file  Other Topics Concern  . Not on file  Social History Narrative  . Not on file     Family History: The patient's family history includes Cancer in an other family member; Heart disease in an other family member; Hypertension in an other family member. There is no history of Colon cancer. ROS:   Please see the history of present illness.     All other systems reviewed and are negative.  EKGs/Labs/Other Studies Reviewed:    The following studies were reviewed today:  Myoview 09/21/2015 Study Highlights   Nuclear stress EF: 56%.  There was no ST segment deviation noted during stress.  Defect 1: There is a small defect of mild severity present in the apex location.  This is a low risk study.   Subtle fixed apical defect. Normal ejection fraction 56% with no wall motion abnormality. No  ischemia. Overall low risk study. Candee Furbish, MD    EKG:  EKG is ordered today.  The ekg ordered today demonstrates NSR with LAFB, short PR  Recent Labs: No results found for requested labs within last 8760 hours.   Recent Lipid Panel    Component Value Date/Time   CHOL 139 04/25/2017 0803   TRIG 117  04/25/2017 0803   HDL 56 04/25/2017 0803   CHOLHDL 2.5 04/25/2017 0803   LDLCALC 60 04/25/2017 0803    Physical Exam:    VS:  BP 140/90   Pulse 62   Ht 5\' 8"  (1.727 m)   Wt 175 lb 12.8 oz (79.7 kg)   SpO2 97%   BMI 26.73 kg/m     Wt Readings from Last 3 Encounters:  05/31/18 175 lb 12.8 oz (79.7 kg)  02/12/17 173 lb 12.8 oz (78.8 kg)  12/15/15 169 lb (76.7 kg)     Physical Exam  Constitutional: He is oriented to person, place, and time. He appears well-developed and well-nourished.  Appears younger than his age  HENT:  Head: Normocephalic and atraumatic.  Neck: Normal range of motion. Neck supple. No JVD present.  Cardiovascular: Normal rate, regular rhythm, normal heart sounds and intact distal pulses. Exam reveals no gallop and no friction rub.  No murmur heard. Pulmonary/Chest: Effort normal and breath sounds normal. No respiratory distress. He has no wheezes. He has no rales.  Abdominal: Soft. Bowel sounds are normal.  Musculoskeletal: Normal range of motion.        General: No deformity or edema.  Neurological: He is alert and oriented to person, place, and time.  Skin: Skin is warm and dry.  Psychiatric: He has a normal mood and affect. His behavior is normal. Judgment and thought content normal.  Vitals reviewed.   ASSESSMENT:    1. Atherosclerosis of native coronary artery of native heart without angina pectoris   2. Essential (primary) hypertension   3. Dyslipidemia    PLAN:    In order of problems listed above:  1.  CAD s/p remote PCI -Continues on aspirin 81 mg, atorvastatin, ARB -Pt is active with no exertional symptoms.  -He exercises  regularly and eats heart healthy diet. He is doing very well, appears younger than his age.   2.  Hypertension -On losartan 100 mg daily. BP well controlled.   -labs per PCP, SCR 1.2 and K+ 4.4 in 01/2018  3.  Hyperlipidemia -On atorvastatin 20 mg daily -Lipid panel in 01/2018 showed LDL 48 which is at goal of <70. HDL 56. Very good! -Continue current care  Medication Adjustments/Labs and Tests Ordered: Current medicines are reviewed at length with the patient today.  Concerns regarding medicines are outlined above. Labs and tests ordered and medication changes are outlined in the patient instructions below:  Patient Instructions  Medication Instructions:  Your physician recommends that you continue on your current medications as directed. Please refer to the Current Medication list given to you today.   If you need a refill on your cardiac medications before your next appointment, please call your pharmacy.   Lab work: NONE ORDERED  TODAY    If you have labs (blood work) drawn today and your tests are completely normal, you will receive your results only by: [name] MyChart Message (if you have MyChart) OR . A paper copy in the mail If you have any lab test that is abnormal or we need to change your treatment, we will call you to review the results.  Testing/Procedures: NONE ORDERED  TODAY     Follow-Up: At [facility], you and your health needs are our priority.  As part of our continuing mission to provide you with exceptional heart care, we have created designated Provider Care Teams.  These Care Teams include your primary Cardiologist (physician) and Advanced Practice Providers (APPs -  Physician Assistants  and Nurse Practitioners) who all work together to provide you with the care you need, when you need it. You will need a follow up appointment in:  1 years.  Please call our office 2 months in advance to schedule this appointment.  You may see Dorris Carnes, MD  or one of the  following Advanced Practice Providers on your designated Care Team: Richardson Dopp, PA-C [place], [place] . Daune Perch, NP  Any Other Special Instructions Will Be Listed Below (If Applicable).   DASH Eating Plan DASH stands for "Dietary Approaches to Stop Hypertension." The DASH eating plan is a healthy eating plan that has been shown to reduce high blood pressure (hypertension). It may also reduce your risk for type 2 diabetes, heart disease, and stroke. The DASH eating plan may also help with weight loss. What are tips for following this plan?  General guidelines  Avoid eating more than 2,300 mg (milligrams) of salt (sodium) a day. If you have hypertension, you may need to reduce your sodium intake to 1,500 mg a day.  Limit alcohol intake to no more than 1 drink a day for nonpregnant women and 2 drinks a day for men. One drink equals 12 oz of beer, 5 oz of wine, or 1 oz of hard liquor.  Work with your health care provider to maintain a healthy body weight or to lose weight. Ask what an ideal weight is for you.  Get at least 30 minutes of exercise that causes your heart to beat faster (aerobic exercise) most days of the week. Activities may include walking, swimming, or biking.  Work with your health care provider or diet and nutrition specialist (dietitian) to adjust your eating plan to your individual calorie needs. Reading food labels   Check food labels for the amount of sodium per serving. Choose foods with less than 5 percent of the Daily Value of sodium. Generally, foods with less than 300 mg of sodium per serving fit into this eating plan.  To find whole grains, look for the word "whole" as the first word in the ingredient list. Shopping  Buy products labeled as "low-sodium" or "no salt added."  Buy fresh foods. Avoid canned foods and premade or frozen meals. Cooking  Avoid adding salt when cooking. Use salt-free seasonings or herbs instead of table salt or sea salt.  Check with your health care provider or pharmacist before using salt substitutes.  Do not fry foods. Cook foods using healthy methods such as baking, boiling, grilling, and broiling instead.  Cook with heart-healthy oils, such as olive, canola, soybean, or sunflower oil. Meal planning  Eat a balanced diet that includes: ? 5 or more servings of fruits and vegetables each day. At each meal, try to fill half of your plate with fruits and vegetables. ? Up to 6-8 servings of whole grains each day. ? Less than 6 oz of lean meat, poultry, or fish each day. A 3-oz serving of meat is about the same size as a deck of cards. One egg equals 1 oz. ? 2 servings of low-fat dairy each day. ? A serving of nuts, seeds, or beans 5 times each week. ? Heart-healthy fats. Healthy fats called Omega-3 fatty acids are found in foods such as flaxseeds and coldwater fish, like sardines, salmon, and mackerel.  Limit how much you eat of the following: ? Canned or prepackaged foods. ? Food that is high in trans fat, such as fried foods. ? Food that is high in saturated  fat, such as fatty meat. ? Sweets, desserts, sugary drinks, and other foods with added sugar. ? Full-fat dairy products.  Do not salt foods before eating.  Try to eat at least 2 vegetarian meals each week.  Eat more home-cooked food and less restaurant, buffet, and fast food.  When eating at a restaurant, ask that your food be prepared with less salt or no salt, if possible. What foods are recommended? The items listed may not be a complete list. Talk with your dietitian about what dietary choices are best for you. Grains Whole-grain or whole-wheat bread. Whole-grain or whole-wheat pasta. Brown rice. [place]. Bulgur. Whole-grain and low-sodium cereals. Pita bread. Low-fat, low-sodium crackers. Whole-wheat flour tortillas. Vegetables Fresh or frozen vegetables (raw, steamed, roasted, or grilled). Low-sodium or reduced-sodium tomato and  vegetable juice. Low-sodium or reduced-sodium tomato sauce and tomato paste. Low-sodium or reduced-sodium canned vegetables. Fruits All fresh, dried, or frozen fruit. Canned fruit in natural juice (without added sugar). Meat and other protein foods Skinless chicken or [place]. Ground chicken or [place]. Pork with fat trimmed off. Fish and seafood. Egg whites. Dried beans, peas, or lentils. Unsalted nuts, nut butters, and seeds. Unsalted canned beans. Lean cuts of beef with fat trimmed off. Low-sodium, lean deli meat. Dairy Low-fat (1%) or fat-free (skim) milk. Fat-free, low-fat, or reduced-fat cheeses. Nonfat, low-sodium ricotta or cottage cheese. Low-fat or nonfat yogurt. Low-fat, low-sodium cheese. Fats and oils Soft margarine without trans fats. Vegetable oil. Low-fat, reduced-fat, or light mayonnaise and salad dressings (reduced-sodium). Canola, safflower, olive, soybean, and sunflower oils. Avocado. Seasoning and other foods Herbs. Spices. Seasoning mixes without salt. Unsalted popcorn and pretzels. Fat-free sweets. What foods are not recommended? The items listed may not be a complete list. Talk with your dietitian about what dietary choices are best for you. Grains Baked goods made with fat, such as croissants, muffins, or some breads. Dry pasta or rice meal packs. Vegetables Creamed or fried vegetables. Vegetables in a cheese sauce. Regular canned vegetables (not low-sodium or reduced-sodium). Regular canned tomato sauce and paste (not low-sodium or reduced-sodium). Regular tomato and vegetable juice (not low-sodium or reduced-sodium). Angie Fava. Olives. Fruits Canned fruit in a light or heavy syrup. Fried fruit. Fruit in cream or butter sauce. Meat and other protein foods Fatty cuts of meat. Ribs. Fried meat. Berniece Salines. Sausage. Bologna and other processed lunch meats. Salami. Fatback. Hotdogs. Bratwurst. Salted nuts and seeds. Canned beans with added salt. Canned or smoked fish. Whole eggs or  egg yolks. Chicken or [place] with skin. Dairy Whole or 2% milk, cream, and half-and-half. Whole or full-fat cream cheese. Whole-fat or sweetened yogurt. Full-fat cheese. Nondairy creamers. Whipped toppings. Processed cheese and cheese spreads. Fats and oils Butter. Stick margarine. Lard. Shortening. Ghee. Bacon fat. Tropical oils, such as coconut, palm kernel, or palm oil. Seasoning and other foods Salted popcorn and pretzels. Onion salt, garlic salt, seasoned salt, table salt, and sea salt. Worcestershire sauce. Tartar sauce. Barbecue sauce. Teriyaki sauce. Soy sauce, including reduced-sodium. Steak sauce. Canned and packaged gravies. Fish sauce. Oyster sauce. Cocktail sauce. Horseradish that you find on the shelf. Ketchup. Mustard. Meat flavorings and tenderizers. Bouillon cubes. Hot sauce and Tabasco sauce. Premade or packaged marinades. Premade or packaged taco seasonings. Relishes. Regular salad dressings. Where to find more information:  National Heart, Lung, and [place]: https://wilson-eaton.com/  American Heart Association: www.heart.org Summary  The DASH eating plan is a healthy eating plan that has been shown to reduce high blood pressure (hypertension). It may also reduce  your risk for type 2 diabetes, heart disease, and stroke.  With the DASH eating plan, you should limit salt (sodium) intake to 2,300 mg a day. If you have hypertension, you may need to reduce your sodium intake to 1,500 mg a day.  When on the DASH eating plan, aim to eat more fresh fruits and vegetables, whole grains, lean proteins, low-fat dairy, and heart-healthy fats.  Work with your health care provider or diet and nutrition specialist (dietitian) to adjust your eating plan to your individual calorie needs. This information is not intended to replace advice given to you by your health care provider. Make sure you discuss any questions you have with your health care provider. Document Released: 05/11/2011  Document Revised: 05/15/2016 Document Reviewed: 05/15/2016 Elsevier Interactive Patient Education  2019 [place], Daune Perch, NP  05/31/2018 1:12 PM    [facility]

## 2018-05-31 ENCOUNTER — Encounter: Payer: Self-pay | Admitting: Cardiology

## 2018-05-31 ENCOUNTER — Ambulatory Visit: Payer: Medicare Other | Admitting: Cardiology

## 2018-05-31 VITALS — BP 140/90 | HR 62 | Ht 68.0 in | Wt 175.8 lb

## 2018-05-31 DIAGNOSIS — I2510 Atherosclerotic heart disease of native coronary artery without angina pectoris: Secondary | ICD-10-CM

## 2018-05-31 DIAGNOSIS — I10 Essential (primary) hypertension: Secondary | ICD-10-CM

## 2018-05-31 DIAGNOSIS — E785 Hyperlipidemia, unspecified: Secondary | ICD-10-CM

## 2018-05-31 MED ORDER — ATORVASTATIN CALCIUM 20 MG PO TABS: 20.0000 mg | ORAL_TABLET | Freq: Every day | ORAL | 3 refills | Status: DC

## 2018-05-31 NOTE — Patient Instructions (Addendum)
Medication Instructions:  Your physician recommends that you continue on your current medications as directed. Please refer to the Current Medication list given to you today.   If you need a refill on your cardiac medications before your next appointment, please call your pharmacy.   Lab work: NONE ORDERED  TODAY    If you have labs (blood work) drawn today and your tests are completely normal, you will receive your results only by: [name] MyChart Message (if you have MyChart) OR . A paper copy in the mail If you have any lab test that is abnormal or we need to change your treatment, we will call you to review the results.  Testing/Procedures: NONE ORDERED  TODAY     Follow-Up: At [facility], you and your health needs are our priority.  As part of our continuing mission to provide you with exceptional heart care, we have created designated Provider Care Teams.  These Care Teams include your primary Cardiologist (physician) and Advanced Practice Providers (APPs -  Physician Assistants and Nurse Practitioners) who all work together to provide you with the care you need, when you need it. You will need a follow up appointment in:  1 years.  Please call our office 2 months in advance to schedule this appointment.  You may see Dorris Carnes, MD  or one of the following Advanced Practice Providers on your designated Care Team: Richardson Dopp, PA-C [place], [place] . Daune Perch, NP  Any Other Special Instructions Will Be Listed Below (If Applicable).   DASH Eating Plan DASH stands for "Dietary Approaches to Stop Hypertension." The DASH eating plan is a healthy eating plan that has been shown to reduce high blood pressure (hypertension). It may also reduce your risk for type 2 diabetes, heart disease, and stroke. The DASH eating plan may also help with weight loss. What are tips for following this plan?  General guidelines  Avoid eating more than 2,300 mg (milligrams) of salt (sodium) a  day. If you have hypertension, you may need to reduce your sodium intake to 1,500 mg a day.  Limit alcohol intake to no more than 1 drink a day for nonpregnant women and 2 drinks a day for men. One drink equals 12 oz of beer, 5 oz of wine, or 1 oz of hard liquor.  Work with your health care provider to maintain a healthy body weight or to lose weight. Ask what an ideal weight is for you.  Get at least 30 minutes of exercise that causes your heart to beat faster (aerobic exercise) most days of the week. Activities may include walking, swimming, or biking.  Work with your health care provider or diet and nutrition specialist (dietitian) to adjust your eating plan to your individual calorie needs. Reading food labels   Check food labels for the amount of sodium per serving. Choose foods with less than 5 percent of the Daily Value of sodium. Generally, foods with less than 300 mg of sodium per serving fit into this eating plan.  To find whole grains, look for the word "whole" as the first word in the ingredient list. Shopping  Buy products labeled as "low-sodium" or "no salt added."  Buy fresh foods. Avoid canned foods and premade or frozen meals. Cooking  Avoid adding salt when cooking. Use salt-free seasonings or herbs instead of table salt or sea salt. Check with your health care provider or pharmacist before using salt substitutes.  Do not fry foods. Cook foods using healthy  methods such as baking, boiling, grilling, and broiling instead.  Cook with heart-healthy oils, such as olive, canola, soybean, or sunflower oil. Meal planning  Eat a balanced diet that includes: ? 5 or more servings of fruits and vegetables each day. At each meal, try to fill half of your plate with fruits and vegetables. ? Up to 6-8 servings of whole grains each day. ? Less than 6 oz of lean meat, poultry, or fish each day. A 3-oz serving of meat is about the same size as a deck of cards. One egg equals 1  oz. ? 2 servings of low-fat dairy each day. ? A serving of nuts, seeds, or beans 5 times each week. ? Heart-healthy fats. Healthy fats called Omega-3 fatty acids are found in foods such as flaxseeds and coldwater fish, like sardines, salmon, and mackerel.  Limit how much you eat of the following: ? Canned or prepackaged foods. ? Food that is high in trans fat, such as fried foods. ? Food that is high in saturated fat, such as fatty meat. ? Sweets, desserts, sugary drinks, and other foods with added sugar. ? Full-fat dairy products.  Do not salt foods before eating.  Try to eat at least 2 vegetarian meals each week.  Eat more home-cooked food and less restaurant, buffet, and fast food.  When eating at a restaurant, ask that your food be prepared with less salt or no salt, if possible. What foods are recommended? The items listed may not be a complete list. Talk with your dietitian about what dietary choices are best for you. Grains Whole-grain or whole-wheat bread. Whole-grain or whole-wheat pasta. Brown rice. [place]row. Bulgur. Whole-grain and low-sodium cereals. Pita bread. Low-fat, low-sodium crackers. Whole-wheat flour tortillas. Vegetables Fresh or frozen vegetables (raw, steamed, roasted, or grilled). Low-sodium or reduced-sodium tomato and vegetable juice. Low-sodium or reduced-sodium tomato sauce and tomato paste. Low-sodium or reduced-sodium canned vegetables. Fruits All fresh, dried, or frozen fruit. Canned fruit in natural juice (without added sugar). Meat and other protein foods Skinless chicken or [place]. Ground chicken or [place]. Pork with fat trimmed off. Fish and seafood. Egg whites. Dried beans, peas, or lentils. Unsalted nuts, nut butters, and seeds. Unsalted canned beans. Lean cuts of beef with fat trimmed off. Low-sodium, lean deli meat. Dairy Low-fat (1%) or fat-free (skim) milk. Fat-free, low-fat, or reduced-fat cheeses. Nonfat, low-sodium ricotta or cottage  cheese. Low-fat or nonfat yogurt. Low-fat, low-sodium cheese. Fats and oils Soft margarine without trans fats. Vegetable oil. Low-fat, reduced-fat, or light mayonnaise and salad dressings (reduced-sodium). Canola, safflower, olive, soybean, and sunflower oils. Avocado. Seasoning and other foods Herbs. Spices. Seasoning mixes without salt. Unsalted popcorn and pretzels. Fat-free sweets. What foods are not recommended? The items listed may not be a complete list. Talk with your dietitian about what dietary choices are best for you. Grains Baked goods made with fat, such as croissants, muffins, or some breads. Dry pasta or rice meal packs. Vegetables Creamed or fried vegetables. Vegetables in a cheese sauce. Regular canned vegetables (not low-sodium or reduced-sodium). Regular canned tomato sauce and paste (not low-sodium or reduced-sodium). Regular tomato and vegetable juice (not low-sodium or reduced-sodium). Angie Fava. Olives. Fruits Canned fruit in a light or heavy syrup. Fried fruit. Fruit in cream or butter sauce. Meat and other protein foods Fatty cuts of meat. Ribs. Fried meat. Berniece Salines. Sausage. Bologna and other processed lunch meats. Salami. Fatback. Hotdogs. Bratwurst. Salted nuts and seeds. Canned beans with added salt. Canned or smoked fish. Whole eggs  or egg yolks. Chicken or [place] with skin. Dairy Whole or 2% milk, cream, and half-and-half. Whole or full-fat cream cheese. Whole-fat or sweetened yogurt. Full-fat cheese. Nondairy creamers. Whipped toppings. Processed cheese and cheese spreads. Fats and oils Butter. Stick margarine. Lard. Shortening. Ghee. Bacon fat. Tropical oils, such as coconut, palm kernel, or palm oil. Seasoning and other foods Salted popcorn and pretzels. Onion salt, garlic salt, seasoned salt, table salt, and sea salt. Worcestershire sauce. Tartar sauce. Barbecue sauce. Teriyaki sauce. Soy sauce, including reduced-sodium. Steak sauce. Canned and packaged gravies.  Fish sauce. Oyster sauce. Cocktail sauce. Horseradish that you find on the shelf. Ketchup. Mustard. Meat flavorings and tenderizers. Bouillon cubes. Hot sauce and Tabasco sauce. Premade or packaged marinades. Premade or packaged taco seasonings. Relishes. Regular salad dressings. Where to find more information:  National Heart, Lung, and [place]: https://wilson-eaton.com/  American Heart Association: www.heart.org Summary  The DASH eating plan is a healthy eating plan that has been shown to reduce high blood pressure (hypertension). It may also reduce your risk for type 2 diabetes, heart disease, and stroke.  With the DASH eating plan, you should limit salt (sodium) intake to 2,300 mg a day. If you have hypertension, you may need to reduce your sodium intake to 1,500 mg a day.  When on the DASH eating plan, aim to eat more fresh fruits and vegetables, whole grains, lean proteins, low-fat dairy, and heart-healthy fats.  Work with your health care provider or diet and nutrition specialist (dietitian) to adjust your eating plan to your individual calorie needs. This information is not intended to replace advice given to you by your health care provider. Make sure you discuss any questions you have with your health care provider. Document Released: 05/11/2011 Document Revised: 05/15/2016 Document Reviewed: 05/15/2016 Elsevier Interactive Patient Education  2019 [employer].

## 2019-04-14 ENCOUNTER — Other Ambulatory Visit: Payer: Self-pay | Admitting: Cardiology

## 2019-06-16 ENCOUNTER — Other Ambulatory Visit: Payer: Self-pay

## 2019-06-16 ENCOUNTER — Encounter: Payer: Self-pay | Admitting: Internal Medicine

## 2019-06-16 ENCOUNTER — Ambulatory Visit: Payer: Medicare Other | Admitting: Internal Medicine

## 2019-06-16 VITALS — BP 140/82 | HR 54 | Ht 68.0 in | Wt 173.8 lb

## 2019-06-16 DIAGNOSIS — I2510 Atherosclerotic heart disease of native coronary artery without angina pectoris: Secondary | ICD-10-CM | POA: Diagnosis not present

## 2019-06-16 NOTE — Patient Instructions (Signed)
Medication Instructions:  No changes *If you need a refill on your cardiac medications before your next appointment, please call your pharmacy*  Lab Work: none If you have labs (blood work) drawn today and your tests are completely normal, you will receive your results only by: [name] MyChart Message (if you have MyChart) OR . A paper copy in the mail If you have any lab test that is abnormal or we need to change your treatment, we will call you to review the results.  Testing/Procedures: none  Follow-Up: At [facility], you and your health needs are our priority.  As part of our continuing mission to provide you with exceptional heart care, we have created designated Provider Care Teams.  These Care Teams include your primary Cardiologist (physician) and Advanced Practice Providers (APPs -  Physician Assistants and Nurse Practitioners) who all work together to provide you with the care you need, when you need it.  Your next appointment:   12 month(s)  The format for your next appointment:   Either In Person or Virtual  Provider:   You may see Dorris Carnes, MD or one of the following Advanced Practice Providers on your designated Care Team:    Richardson Dopp, PA-C  [place], [place]  Daune Perch, NP   Other Instructions

## 2019-06-16 NOTE — Progress Notes (Addendum)
Cardiology Office Note   Date:  06/16/2019   ID:  [name] [name], DOB Oct 04, 1933, MRN [id number]  PCP:  [name], MD  Cardiologist:   Dorris Carnes, MD   F/U of CAD    History of Present Illness: [name] [name] is a 86 y.o. male with a history off HTN and CAD  Followed by Dr Osborne Casco as his primary provider  He underwent  PTCA in [place] in 2017   Normal  LVEF 56%  I saw the pt in 2018  He was seen by N hammond in 2019     The pt continues to do well  He denies CP   Breathing has been ok   He is not as active as he had been in past   Unfortunatley due to [place] he is no longer  dancing   Doesn't walk outside much  Current Meds  Medication Sig  . ANDROGEL PUMP 20.25 MG/ACT (1.62%) GEL daily. 2 pumps daily  . ascorbic acid (VITAMIN C) 500 MG tablet Take 500 mg by mouth daily.  [name] aspirin EC 81 MG tablet Take 81 mg by mouth 2 (two) times daily.   [name] atorvastatin (LIPITOR) 20 MG tablet TAKE 1 TABLET (20 MG TOTAL) BY MOUTH DAILY AT 6 PM.  . Cholecalciferol (VITAMIN D-1000 MAX ST) 25 MCG (1000 UT) tablet Take 1 tablet by mouth daily.  . fish oil-omega-3 fatty acids 1000 MG capsule Take 1 g by mouth daily.   [name] latanoprost (XALATAN) 0.005 % ophthalmic solution Place 1 drop into both eyes at bedtime.  [name] losartan (COZAAR) 100 MG tablet Take 1 tablet (100 mg total) by mouth daily.  . methenamine (HIPREX) 1 g tablet Take 1 g by mouth 2 (two) times daily.  . tadalafil (CIALIS) 5 MG tablet Take 5 mg by mouth every evening.   . tamsulosin (FLOMAX) 0.4 MG CAPS capsule Take 0.4 mg by mouth daily.  . timolol (TIMOPTIC) 0.5 % ophthalmic solution Place 1 drop into both eyes daily.  . vitamin B-12 (CYANOCOBALAMIN) 1000 MCG tablet Take 1,000 mcg by mouth daily.  . Zinc 30 MG CAPS Take 1 capsule by mouth daily.     Allergies:   Sulfa antibiotics, Sulfasalazine, and Sulfamethoxazole   Past Medical History:  Diagnosis Date  . BPH with obstruction/lower urinary tract symptoms   .  Coronary artery disease    CARDIOLOGIST-  DR Chucky Homes  . Diverticulosis of colon   . ED (erectile dysfunction)   . History of adenomatous polyp of colon    tubular adenom2005;  2008;  2015  . History of MI (myocardial infarction)    1991 s/p angioplasty  . History of prostate cancer urologist-  dr Risa Grill--  last PSA  0.09 (10/ 2016)   dx 2009--  s/p  radioactive seed implants 06-10-2008  . History of recurrent cystitis   . Hyperlipidemia   . Hypertension   . Hypogonadism, testicular   . Incomplete emptying of bladder   . Urethral stricture   . Urethral stricture   . Wears contact lenses   . Wears partial dentures    upper    Past Surgical History:  Procedure Laterality Date  . CARDIOVASCULAR STRESS TEST  09-21-2015   dr Dorris Carnes   Low risk nuclear study w/ small defect of mild severity in apex , no ischemia /  normal LV function and wall motion , ef 56%  . CORONARY ANGIOPLASTY  1991  .  CYSTO/  REMOVAL OF OBJECT    . CYSTOSCOPY WITH URETHRAL DILATATION  06/10/2012   Procedure: CYSTOSCOPY WITH URETHRAL DILATATION;  Surgeon: Bernestine Amass, MD;  Location: [facility];  Service: Urology;  Laterality: N/A;  . CYSTOSCOPY WITH URETHRAL DILATATION N/A 12/15/2015   Procedure: CYSTOSCOPY WITH URETHRA L BALLOON  DILATATION;  Surgeon: Rana Snare, MD;  Location: [facility];  Service: Urology;  Laterality: N/A;  . GREEN LIGHT LASER TURP (TRANSURETHRAL RESECTION OF PROSTATE  2012 approx.  [name] RADIOACTIVE PROSTATE SEED IMPLANTS  06-10-2008     Social History:  The patient  reports that he quit smoking about 53 years ago. His smoking use included cigarettes. He has a 8.00 pack-year smoking history. He has never used smokeless tobacco. He reports current alcohol use of about 2.0 standard drinks of alcohol per week. He reports that he does not use drugs.   Family History:  The patient's family history includes Cancer in an other family member; Heart disease in an  other family member; Hypertension in an other family member.    ROS:  Please see the history of present illness. All other systems are reviewed and  Negative to the above problem except as noted.    PHYSICAL EXAM: VS:  BP 140/82   Pulse (!) 54   Ht 5\' 8"  (1.727 m)   Wt 173 lb 12.8 oz (78.8 kg)   SpO2 96%   BMI 26.43 kg/m   GEN: Well nourished, well developed, in no acute distress  HEENT: normal  Neck: no JVD Cardiac: RRR; no murmurs, rubs, or gallops,no edema  Respiratory:  clear to auscultation bilaterally, normal work of breathing GI: soft, nontender, nondistended, + BS  No hepatomegaly  MS: no deformity Moving all extremities   Skin: warm and dry, no rash Neuro:  Strength and sensation are intact Psych: euthymic mood, full affect   EKG:  EKG is  ordered today.  SB 54 bpm     Lipid Panel    Component Value Date/Time   CHOL 139 04/25/2017 0803   TRIG 117 04/25/2017 0803   HDL 56 04/25/2017 0803   CHOLHDL 2.5 04/25/2017 0803   LDLCALC 60 04/25/2017 0803      Wt Readings from Last 3 Encounters:  06/16/19 173 lb 12.8 oz (78.8 kg)  05/31/18 175 lb 12.8 oz (79.7 kg)  02/12/17 173 lb 12.8 oz (78.8 kg)      ASSESSMENT AND PLAN:  1  CAD   No symptoms of angina   COntinue to follow  2  HL   Last LDL 52  Continue statin  3  HTN   Fair.  I would not push further.   He says it is lower at home      Current medicines are reviewed at length with the patient today.  The patient does not have concerns regarding medicines.  Signed, Dorris Carnes, MD  06/16/2019 3:57 PM    [address], [place], [place]  [address] Phone: [phone]; Fax: [phone]

## 2019-06-20 ENCOUNTER — Ambulatory Visit: Payer: Medicare Other | Attending: Internal Medicine

## 2019-06-20 DIAGNOSIS — Z23 Encounter for immunization: Secondary | ICD-10-CM | POA: Insufficient documentation

## 2019-06-20 NOTE — Progress Notes (Signed)
   Covid-19 Vaccination Clinic  Name:  [name] [name]    MRN: [id number] DOB: 05-16-1934  06/20/2019  [name] was observed post Covid-19 immunization for 15 minutes without incidence. He was provided with Vaccine Information Sheet and instruction to access the V-Safe system.   [name] was instructed to call 911 with any severe reactions post vaccine: [name] Difficulty breathing  . Swelling of your face and throat  . A fast heartbeat  . A bad rash all over your body  . Dizziness and weakness    Immunizations Administered    Name Date Dose VIS Date Route   Pfizer COVID-19 Vaccine 06/20/2019 11:18 AM 0.3 mL 05/16/2019 Intramuscular   Manufacturer: [employer], [employer]   Lot: [id number]   [place]: [id number]

## 2019-07-08 ENCOUNTER — Ambulatory Visit: Payer: Medicare Other | Attending: Internal Medicine

## 2019-07-08 ENCOUNTER — Ambulatory Visit: Payer: Medicare Other

## 2019-07-08 DIAGNOSIS — Z23 Encounter for immunization: Secondary | ICD-10-CM

## 2020-02-02 ENCOUNTER — Other Ambulatory Visit: Payer: Self-pay

## 2020-02-02 MED ORDER — ATORVASTATIN CALCIUM 20 MG PO TABS: 20.0000 mg | ORAL_TABLET | Freq: Every day | ORAL | 0 refills | Status: DC

## 2020-05-20 ENCOUNTER — Other Ambulatory Visit: Payer: Self-pay | Admitting: Internal Medicine

## 2020-05-20 DIAGNOSIS — M799 Soft tissue disorder, unspecified: Secondary | ICD-10-CM

## 2020-06-11 ENCOUNTER — Other Ambulatory Visit: Payer: Self-pay | Admitting: Internal Medicine

## 2020-06-11 ENCOUNTER — Ambulatory Visit
Admission: RE | Admit: 2020-06-11 | Discharge: 2020-06-11 | Disposition: A | Discharge status: Home / Self Care | Payer: Medicare Other | Source: Ambulatory Visit | Attending: Internal Medicine | Admitting: Internal Medicine

## 2020-06-11 DIAGNOSIS — M799 Soft tissue disorder, unspecified: Secondary | ICD-10-CM

## 2020-06-24 NOTE — Progress Notes (Signed)
Cardiology Office Note   Date:  06/29/2020   ID:  [name] [name], DOB [birth year], MRN [id number]  PCP:  [name], MD  Cardiologist:   Dorris Carnes, MD   F/U of CAD    History of Present Illness: [name] [name] is a 87 y.o. male with a history off HTN and CAD  Followed by Dr Osborne Casco as his primary provider  He underwent  PTCA in [place] in 2017   Normal  LVEF 56%  I saw the pt in 2021  No CP  NO SOB   Pt admits he needs to exercise/walk more  BP at hoome is 120s to 130s   /     The pt complains of Increased stress at home   His wife has  parkinsons and he is the  caregiver   Current Meds  Medication Sig  . ANDROGEL PUMP 20.25 MG/ACT (1.62%) GEL daily. 2 pumps daily  . ascorbic acid (VITAMIN C) 500 MG tablet Take 500 mg by mouth daily.  [name] aspirin EC 81 MG tablet Take 81 mg by mouth 2 (two) times daily.   [name] atorvastatin (LIPITOR) 20 MG tablet Take 1 tablet (20 mg total) by mouth daily at 6 PM.  . Cholecalciferol 25 MCG (1000 UT) tablet Take 1 tablet by mouth daily.  . Folic Acid 0.8 MG CAPS   . latanoprost (XALATAN) 0.005 % ophthalmic solution Place 1 drop into both eyes at bedtime.  [name] losartan (COZAAR) 100 MG tablet Take 1 tablet (100 mg total) by mouth daily.  . tadalafil (CIALIS) 5 MG tablet Take 5 mg by mouth every evening.   . tamsulosin (FLOMAX) 0.4 MG CAPS capsule Take 0.4 mg by mouth daily.  . timolol (TIMOPTIC) 0.5 % ophthalmic solution Place 1 drop into both eyes daily.  . vitamin B-12 (CYANOCOBALAMIN) 1000 MCG tablet Take 1,000 mcg by mouth daily.     Allergies:   Sulfa antibiotics, Sulfasalazine, Sulfamethoxazole-trimethoprim, and Sulfamethoxazole   Past Medical History:  Diagnosis Date  . BPH with obstruction/lower urinary tract symptoms   . Coronary artery disease    CARDIOLOGIST-  DR Laurna Shetley  . Diverticulosis of colon   . ED (erectile dysfunction)   . History of adenomatous polyp of colon    tubular adenom2005;  2008;  2015  .  History of MI (myocardial infarction)    1991 s/p angioplasty  . History of prostate cancer urologist-  dr Risa Grill--  last PSA  0.09 (10/ 2016)   dx 2009--  s/p  radioactive seed implants 06-10-2008  . History of recurrent cystitis   . Hyperlipidemia   . Hypertension   . Hypogonadism, testicular   . Incomplete emptying of bladder   . Urethral stricture   . Urethral stricture   . Wears contact lenses   . Wears partial dentures    upper    Past Surgical History:  Procedure Laterality Date  . CARDIOVASCULAR STRESS TEST  09-21-2015   dr Dorris Carnes   Low risk nuclear study w/ small defect of mild severity in apex , no ischemia /  normal LV function and wall motion , ef 56%  . CORONARY ANGIOPLASTY  1991  . CYSTO/  REMOVAL OF OBJECT    . CYSTOSCOPY WITH URETHRAL DILATATION  06/10/2012   Procedure: CYSTOSCOPY WITH URETHRAL DILATATION;  Surgeon: Bernestine Amass, MD;  Location: [facility];  Service: Urology;  Laterality: N/A;  . CYSTOSCOPY WITH URETHRAL DILATATION  N/A 12/15/2015   Procedure: CYSTOSCOPY WITH URETHRA L BALLOON  DILATATION;  Surgeon: Rana Snare, MD;  Location[facility];  Service: Urology;  Laterality: N/A;  . GREEN LIGHT LASER TURP (TRANSURETHRAL RESECTION OF PROSTATE  2012 approx.  [name] RADIOACTIVE PROSTATE SEED IMPLANTS  06-10-2008     Social History:  The patient  reports that he quit smoking about 54 years ago. His smoking use included cigarettes. He has a 8.00 pack-year smoking history. He has never used smokeless tobacco. He reports current alcohol use of about 2.0 standard drinks of alcohol per week. He reports that he does not use drugs.   Family History:  The patient's family history includes Cancer in an other family member; Heart disease in an other family member; Hypertension in an other family member.    ROS:  Please see the history of present illness. All other systems are reviewed and  Negative to the above problem except as noted.     PHYSICAL EXAM: VS:  BP (!) 148/82   Pulse 69   Ht 5\' 8"  (1.727 m)   Wt 175 lb 3.2 oz (79.5 kg)   BMI 26.64 kg/m   GEN: Well nourished, well developed, in no acute distress  HEENT: normal  Neck: no JVD Cardiac: RRR; no murmurs  No LE edema  Respiratory:  clear to auscultation bilaterally, GI: soft, nontender, nondistended, + BS  No hepatomegaly  MS: no deformity Moving all extremities   Skin: warm and dry, no rash Neuro:  Strength and sensation are intact Psych: euthymic mood, full affect   EKG:  EKG is  ordered today.  SB 54 bpm     Lipid Panel    Component Value Date/Time   CHOL 139 04/25/2017 0803   TRIG 117 04/25/2017 0803   HDL 56 04/25/2017 0803   CHOLHDL 2.5 04/25/2017 0803   LDLCALC 60 04/25/2017 0803      Wt Readings from Last 3 Encounters:  06/29/20 175 lb 3.2 oz (79.5 kg)  06/16/19 173 lb 12.8 oz (78.8 kg)  05/31/18 175 lb 12.8 oz (79.7 kg)      ASSESSMENT AND PLAN:  1  CAD   Doing good  No symptoms of angina   2  HL   Lipid panel from 1 year ago was excellent  Due to see Dr Osborne Casco soon   Will wait on labs  3  HTN  BP readings from home are bettter    Follow  Encouraged him to take cuff to PCP office to confirm accurate  Plan for f/u in 1 year.  Sooner for problems    Current medicines are reviewed at length with the patient today.  The patient does not have concerns regarding medicines.  Signed, Dorris Carnes, MD  06/29/2020 2:32 PM    [facility] [place], [place], [place]  [address] Phone: [phone]; Fax: [phone]

## 2020-06-29 ENCOUNTER — Other Ambulatory Visit: Payer: Self-pay

## 2020-06-29 ENCOUNTER — Encounter: Payer: Self-pay | Admitting: Internal Medicine

## 2020-06-29 ENCOUNTER — Ambulatory Visit: Payer: Medicare Other | Admitting: Internal Medicine

## 2020-06-29 VITALS — BP 148/82 | HR 69 | Ht 68.0 in | Wt 175.2 lb

## 2020-06-29 DIAGNOSIS — I2510 Atherosclerotic heart disease of native coronary artery without angina pectoris: Secondary | ICD-10-CM | POA: Diagnosis not present

## 2020-06-29 DIAGNOSIS — I252 Old myocardial infarction: Secondary | ICD-10-CM | POA: Diagnosis not present

## 2020-06-29 NOTE — Patient Instructions (Signed)
Medication Instructions:  Your physician recommends that you continue on your current medications as directed. Please refer to the Current Medication list given to you today.  *If you need a refill on your cardiac medications before your next appointment, please call your pharmacy*  Follow-Up: At [facility], you and your health needs are our priority.  As part of our continuing mission to provide you with exceptional heart care, we have created designated Provider Care Teams.  These Care Teams include your primary Cardiologist (physician) and Advanced Practice Providers (APPs -  Physician Assistants and Nurse Practitioners) who all work together to provide you with the care you need, when you need it.   Your next appointment:   1 year(s)  The format for your next appointment:   In Person  Provider:   You may see Dorris Carnes, MD or one of the following Advanced Practice Providers on your designated Care Team:    Richardson Dopp, PA-C  [place], [place]

## 2020-08-03 ENCOUNTER — Other Ambulatory Visit: Payer: Self-pay | Admitting: Ophthalmology

## 2020-08-03 DIAGNOSIS — H532 Diplopia: Secondary | ICD-10-CM

## 2020-08-24 ENCOUNTER — Ambulatory Visit
Admission: RE | Admit: 2020-08-24 | Discharge: 2020-08-24 | Disposition: A | Discharge status: Home / Self Care | Payer: Medicare Other | Source: Ambulatory Visit | Attending: Ophthalmology | Admitting: Ophthalmology

## 2020-08-24 DIAGNOSIS — H532 Diplopia: Secondary | ICD-10-CM

## 2020-08-24 MED ORDER — GADOBENATE DIMEGLUMINE 529 MG/ML IV SOLN
15.0000 mL | Freq: Once | INTRAVENOUS | Status: AC | PRN
  Administered 2020-08-24: 15 mL via INTRAVENOUS

## 2020-08-27 ENCOUNTER — Other Ambulatory Visit: Payer: Self-pay | Admitting: Internal Medicine

## 2020-09-15 ENCOUNTER — Encounter: Payer: Self-pay | Admitting: Neurology

## 2020-09-22 ENCOUNTER — Other Ambulatory Visit (HOSPITAL_COMMUNITY): Payer: Self-pay | Admitting: Internal Medicine

## 2020-09-22 ENCOUNTER — Ambulatory Visit (HOSPITAL_COMMUNITY)
Admission: RE | Admit: 2020-09-22 | Discharge: 2020-09-22 | Disposition: A | Discharge status: Home / Self Care | Payer: Medicare Other | Source: Ambulatory Visit | Attending: Internal Medicine | Admitting: Internal Medicine

## 2020-09-22 ENCOUNTER — Other Ambulatory Visit: Payer: Self-pay

## 2020-09-22 DIAGNOSIS — I679 Cerebrovascular disease, unspecified: Secondary | ICD-10-CM

## 2020-11-16 NOTE — Progress Notes (Signed)
NEUROLOGY CONSULTATION NOTE  [name] [name] MRN: [id number] DOB: 11/26/1933  Referring provider: Domenick Gong, MD Primary care provider: Domenick Gong, MD  Reason for consult:  double vision  Assessment/Plan:   Vertical diplopia - consider ocular myasthenia and thyroid eye disease.  Consider third nerve palsy, however normally diplopia worse when looking near.  Regardless, will evaluate for pcom aneurysm.  Sixth nerve palsy tends to have worse diplopia with distance, but that is typically horizontal.  Mass lesion ruled out.  He does exhibit some chronic small vessel disease involving the pons but no clear infarct.  Regardless, he is already on optimal medical management of stroke risk factors (antiplatelet therapy, statin, antihypertensive medications).   Check Myasthenia panel and TSH  Check MRA of head  If testing unremarkable, consider referral to neuro-ophthalmology   Subjective:  [name] [name] is an 87 year old right-handed male with CAD, HTN, HLD and history of prostate cancer who presents for double vision.  History supplemented by referring provider's note.  When he got a new prescription for his glasses over a year ago, he noticed diplopia with distance.  If he read a road sign while driving, there would be vertical overlap of the letters.  If he shuts one eye, it is clear.  He doesn't notice this when looking at distance without his glasses, but his vision is poor.  It has not gotten worse over the past year.  No ocular pain or headaches.  It does not fluctuate or is worse a particular time of day.  Denies drooping of eyelids or weakness of his neck or extremities.  He was evaluated by ophthalmology.  MRI of brain and orbits with and without contrast on 08/24/2020, personally reviewed, showed mild chronic small vessel disease (including involvement of the pons) with chronic lacunar infarct within the left corona radiata.  Ultimately, an ophthalmologic etiology was not  determined.   PAST MEDICAL HISTORY: Past Medical History:  Diagnosis Date   BPH with obstruction/lower urinary tract symptoms    Coronary artery disease    CARDIOLOGIST-  DR PAULA ROSS   Diverticulosis of colon    ED (erectile dysfunction)    History of adenomatous polyp of colon    tubular adenom2005;  2008;  2015   History of MI (myocardial infarction)    1991 s/p angioplasty   History of prostate cancer urologist-  dr Risa Grill--  last PSA  0.09 (10/ 2016)   dx 2009--  s/p  radioactive seed implants 06-10-2008   History of recurrent cystitis    Hyperlipidemia    Hypertension    Hypogonadism, testicular    Incomplete emptying of bladder    Urethral stricture    Urethral stricture    Wears contact lenses    Wears partial dentures    upper    PAST SURGICAL HISTORY: Past Surgical History:  Procedure Laterality Date   CARDIOVASCULAR STRESS TEST  09-21-2015   dr Dorris Carnes   Low risk nuclear study w/ small defect of mild severity in apex , no ischemia /  normal LV function and wall motion , ef 56%   CORONARY ANGIOPLASTY  1991   CYSTO/  REMOVAL OF OBJECT     CYSTOSCOPY WITH URETHRAL DILATATION  06/10/2012   Procedure: CYSTOSCOPY WITH URETHRAL DILATATION;  Surgeon: Bernestine Amass, MD;  Location: [facility];  Service: Urology;  Laterality: N/A;   CYSTOSCOPY WITH URETHRAL DILATATION N/A 12/15/2015   Procedure: CYSTOSCOPY WITH URETHRA L BALLOON  DILATATION;  Surgeon: Rana Snare, MD;  Location: [facility];  Service: Urology;  Laterality: N/A;   GREEN LIGHT LASER TURP (TRANSURETHRAL RESECTION OF PROSTATE  2012 approx.   RADIOACTIVE PROSTATE SEED IMPLANTS  06-10-2008    MEDICATIONS: Current Outpatient Medications on File Prior to Visit  Medication Sig Dispense Refill   ANDROGEL PUMP 20.25 MG/ACT (1.62%) GEL daily. 2 pumps daily     ascorbic acid (VITAMIN C) 500 MG tablet Take 500 mg by mouth daily.     aspirin EC 81 MG tablet Take 81 mg by mouth 2  (two) times daily.      atorvastatin (LIPITOR) 20 MG tablet TAKE 1 TABLET (20 MG TOTAL) BY MOUTH DAILY AT 6 PM. 90 tablet 3   Cholecalciferol 25 MCG (1000 UT) tablet Take 1 tablet by mouth daily.     Folic Acid 0.8 MG CAPS      latanoprost (XALATAN) 0.005 % ophthalmic solution Place 1 drop into both eyes at bedtime.     losartan (COZAAR) 100 MG tablet Take 1 tablet (100 mg total) by mouth daily. 90 tablet 3   tadalafil (CIALIS) 5 MG tablet Take 5 mg by mouth every evening.      tamsulosin (FLOMAX) 0.4 MG CAPS capsule Take 0.4 mg by mouth daily.     timolol (TIMOPTIC) 0.5 % ophthalmic solution Place 1 drop into both eyes daily.     vitamin B-12 (CYANOCOBALAMIN) 1000 MCG tablet Take 1,000 mcg by mouth daily.     No current facility-administered medications on file prior to visit.    ALLERGIES: Allergies  Allergen Reactions   Sulfa Antibiotics Rash   Sulfasalazine Rash   Sulfamethoxazole-Trimethoprim Other (See Comments)   Sulfamethoxazole Rash    FAMILY HISTORY: Family History  Problem Relation Age of Onset   Heart disease Other    Hypertension Other    Cancer Other    Colon cancer Neg Hx     Objective:  Blood pressure (!) 169/88, pulse 60, height 5\' 8"  (1.727 m), weight 173 lb 9.6 oz (78.7 kg), SpO2 97 %. General: No acute distress.  Patient appears well-groomed.   Head:  Normocephalic/atraumatic Eyes:  fundi examined but not visualized Neck: supple, no paraspinal tenderness, full range of motion Back: No paraspinal tenderness Heart: regular rate and rhythm Lungs: Clear to auscultation bilaterally. Vascular: No carotid bruits. Neurological Exam: Mental status: alert and oriented to person, place, and time, recent and remote memory intact, fund of knowledge intact, attention and concentration intact, speech fluent and not dysarthric, language intact. Cranial nerves: CN I: not tested CN II: pupils equal, round and reactive to light, visual fields intact CN III, IV, VI:   full range of motion, no nystagmus, no ptosis CN V: facial sensation intact. CN VII: upper and lower face symmetric CN VIII: hearing intact CN IX, X: gag intact, uvula midline CN XI: sternocleidomastoid and trapezius muscles intact CN XII: tongue midline Bulk & Tone: normal, no fasciculations. Motor:  muscle strength 5/5 throughout Sensation:  Pinprick, temperature and vibratory sensation intact. Deep Tendon Reflexes:  2+ throughout,  toes downgoing.   Finger to nose testing:  Without dysmetria.   Heel to shin:  Without dysmetria.   Gait:  Normal station and stride.  Romberg negative.    Thank you for allowing me to take part in the care of this patient.  Metta Clines, DO  CC: Domenick Gong, MD

## 2020-11-18 ENCOUNTER — Ambulatory Visit: Payer: Medicare Other | Admitting: Neurology

## 2020-11-18 ENCOUNTER — Encounter: Payer: Self-pay | Admitting: Neurology

## 2020-11-18 ENCOUNTER — Other Ambulatory Visit: Payer: Self-pay

## 2020-11-18 VITALS — BP 169/88 | HR 60 | Ht 68.0 in | Wt 173.6 lb

## 2020-11-18 DIAGNOSIS — H532 Diplopia: Secondary | ICD-10-CM | POA: Diagnosis not present

## 2020-11-18 NOTE — Patient Instructions (Signed)
We will check myasthenia gravis panel and TSH as well as MRA of head.  Further recommendations pending results.  If testing negative, would refer to neuroophthalmology

## 2020-11-19 ENCOUNTER — Other Ambulatory Visit (INDEPENDENT_AMBULATORY_CARE_PROVIDER_SITE_OTHER): Payer: Medicare Other

## 2020-11-19 ENCOUNTER — Other Ambulatory Visit: Payer: Self-pay

## 2020-11-19 DIAGNOSIS — H532 Diplopia: Secondary | ICD-10-CM

## 2020-11-19 LAB — TSH: TSH: 1.98 u[IU]/mL (ref 0.35–4.50)

## 2020-11-30 ENCOUNTER — Telehealth: Payer: Self-pay | Admitting: Neurology

## 2020-12-02 ENCOUNTER — Ambulatory Visit
Admission: RE | Admit: 2020-12-02 | Discharge: 2020-12-02 | Disposition: A | Discharge status: Home / Self Care | Payer: Medicare Other | Source: Ambulatory Visit | Attending: Neurology | Admitting: Neurology

## 2020-12-02 ENCOUNTER — Other Ambulatory Visit: Payer: Self-pay

## 2020-12-02 DIAGNOSIS — H532 Diplopia: Secondary | ICD-10-CM

## 2020-12-09 ENCOUNTER — Telehealth: Payer: Self-pay

## 2020-12-09 DIAGNOSIS — H532 Diplopia: Secondary | ICD-10-CM

## 2020-12-09 NOTE — Telephone Encounter (Signed)
Pt advised of [facility] results.

## 2020-12-09 NOTE — Telephone Encounter (Signed)
-----   Message from Pieter Partridge, DO sent at 12/03/2020  4:20 PM EDT ----- MRA is unremarkable.  All testing has been unremarkable.  I would refer to neuro-ophthalmology at [facility] or [place].

## 2020-12-09 NOTE — Telephone Encounter (Signed)
Pt advised of his MRA results. Pt wanted a update on his [facility] lab.

## 2021-01-07 ENCOUNTER — Telehealth: Payer: Self-pay | Admitting: Neurology

## 2021-01-07 NOTE — Telephone Encounter (Signed)
Telephone call to [place] office referral 07/22/21 at 10 am.

## 2021-01-07 NOTE — Telephone Encounter (Signed)
Pt called regarding his referral to opthalmology. They told him they told take fax referrals, they only take phone referrals. Their number is [phone]

## 2021-02-02 ENCOUNTER — Telehealth: Payer: Self-pay

## 2021-02-02 NOTE — Telephone Encounter (Signed)
New message   Office notes fax to Ophthalmology  Fax # [phone]

## 2021-06-15 DIAGNOSIS — H6991 Unspecified Eustachian tube disorder, right ear: Secondary | ICD-10-CM | POA: Insufficient documentation

## 2021-06-15 DIAGNOSIS — R1313 Dysphagia, pharyngeal phase: Secondary | ICD-10-CM | POA: Insufficient documentation

## 2021-08-21 ENCOUNTER — Other Ambulatory Visit: Payer: Self-pay | Admitting: Internal Medicine

## 2021-09-07 NOTE — Progress Notes (Signed)
? ?Cardiology Office Note ? ? ?Date:  09/08/2021  ? ?ID:  [name] [name], DOB 05-23-1934, MRN [id number] ? ?PCP:  [name], MD  ?Cardiologist:   Dorris Carnes, MD  ? ?F/U of CAD  ?  ?History of Present Illness: ?[name] [name] is a 88 y.o. male with a history off HTN and CAD  Followed by Dr Osborne Casco as his primary provider ? He underwent  PTCA in [place] in 2017   Normal  LVEF 56%  ? ?I saw the pt in jan 2022 ? ?Since seen he has done well from a cardiac standpoint   Denies CP  No SOB ?[place] under increased stress as wife has Parkinson's, lives at home  ? ? ?Current Meds  ?Medication Sig  ? ANDROGEL PUMP 20.25 MG/ACT (1.62%) GEL daily. 2 pumps daily  ? aspirin EC 81 MG tablet Take 81 mg by mouth 2 (two) times daily.   ? atorvastatin (LIPITOR) 20 MG tablet Take 1 tablet (20 mg total) by mouth daily at 6 PM. Please make overdue appt with Dr. Harrington Challenger before anymore refills. Thank you 1st attempt  ? latanoprost (XALATAN) 0.005 % ophthalmic solution Place 1 drop into both eyes at bedtime.  ? losartan (COZAAR) 100 MG tablet Take 1 tablet (100 mg total) by mouth daily.  ? tadalafil (CIALIS) 5 MG tablet Take 5 mg by mouth every evening.   ? tamsulosin (FLOMAX) 0.4 MG CAPS capsule Take 0.4 mg by mouth daily.  ? timolol (TIMOPTIC) 0.5 % ophthalmic solution Place 1 drop into both eyes daily.  ? ? ? ?Allergies:   Sulfa antibiotics, Sulfasalazine, Sulfamethoxazole-trimethoprim, and Sulfamethoxazole  ? ?Past Medical History:  ?Diagnosis Date  ? BPH with obstruction/lower urinary tract symptoms   ? Coronary artery disease   ? CARDIOLOGIST-  DR Nevin Bloodgood Taliyah Watrous  ? Diverticulosis of colon   ? ED (erectile dysfunction)   ? History of adenomatous polyp of colon   ? tubular adenom2005;  2008;  2015  ? History of MI (myocardial infarction)   ? 1991 s/p angioplasty  ? History of prostate cancer urologist-  dr Risa Grill--  last PSA  0.09 (10/ 2016)  ? dx 2009--  s/p  radioactive seed implants 06-10-2008  ? History of recurrent  cystitis   ? Hyperlipidemia   ? Hypertension   ? Hypogonadism, testicular   ? Incomplete emptying of bladder   ? Urethral stricture   ? Urethral stricture   ? Wears contact lenses   ? Wears partial dentures   ? upper  ? ? ?Past Surgical History:  ?Procedure Laterality Date  ? CARDIOVASCULAR STRESS TEST  09-21-2015   dr Dorris Carnes  ? Low risk nuclear study w/ small defect of mild severity in apex , no ischemia /  normal LV function and wall motion , ef 56%  ? CORONARY ANGIOPLASTY  1991  ? CYSTO/  REMOVAL OF OBJECT    ? CYSTOSCOPY WITH URETHRAL DILATATION  06/10/2012  ? Procedure: CYSTOSCOPY WITH URETHRAL DILATATION;  Surgeon: Bernestine Amass, MD;  Location[facility];  Service: Urology;  Laterality: N/A;  ? CYSTOSCOPY WITH URETHRAL DILATATION N/A 12/15/2015  ? Procedure: CYSTOSCOPY WITH URETHRA L BALLOON  DILATATION;  Surgeon: Rana Snare, MD;  Location[facility];  Service: Urology;  Laterality: N/A;  ? GREEN LIGHT LASER TURP (TRANSURETHRAL RESECTION OF PROSTATE  2012 approx.  ? RADIOACTIVE PROSTATE SEED IMPLANTS  06-10-2008  ? ? ? ?Social History:  The patient  reports that he quit smoking about 55 years ago. His smoking use included cigarettes. He has a 8.00 pack-year smoking history. He has never used smokeless tobacco. He reports current alcohol use of about 2.0 standard drinks per week. He reports that he does not use drugs.  ? ?Family History:  The patient's family history includes Cancer in an other family member; Dementia in his mother; Heart disease in an other family member; Hypertension in an other family member; Parkinsonism in his brother.  ? ? ?ROS:  Please see the history of present illness. All other systems are reviewed and  Negative to the above problem except as noted.  ? ? ?PHYSICAL EXAM: ?VS:  BP (!) 148/90   Pulse (!) 55   Ht '5\' 8"'$  (1.727 m)   Wt 171 lb 3.2 oz (77.7 kg)   SpO2 94%   BMI 26.03 kg/m?   ?GEN: Well nourished, well developed, in no acute distress   ?HEENT: normal  ?Neck: JVP is normla   No bruits    ?Cardiac: RRR; no murmurs  No LE edema  ?Respiratory:  clear to auscultation bilaterally, ?GI: soft, nontender, nondistended, + BS  No hepatomegaly  ?MS: no deformity Moving all extremities   ?Skin: warm and dry, no rash ?Neuro:  Strength and sensation are intact ?Psych: euthymic mood, full affect ? ? ?EKG:  EKG is  ordered today.  SB 55 bpm   ? ? ?Lipid Panel ?   ?Component Value Date/Time  ? CHOL 139 04/25/2017 0803  ? TRIG 117 04/25/2017 0803  ? HDL 56 04/25/2017 0803  ? CHOLHDL 2.5 04/25/2017 0803  ? [place] 60 04/25/2017 0803  ? ?  ? ?Wt Readings from Last 3 Encounters:  ?09/08/21 171 lb 3.2 oz (77.7 kg)  ?11/18/20 173 lb 9.6 oz (78.7 kg)  ?06/29/20 175 lb 3.2 oz (79.5 kg)  ?  ? ? ?ASSESSMENT AND PLAN: ? ?1  CAD   Doing good  Remains symptom free  ? ?2  HL   tLIpids from March 2022 are excellent   Will contact Dr Sonda Primes office for more recent labs  ? ?3  HTN  BP here is high  Log from home shows BP in 120s to 130s  Consistent with white coat HTN ? ?Plan for f/u in 1 year.  Sooner for problems   ? ? ?Current medicines are reviewed at length with the patient today.  The patient does not have concerns regarding medicines. ? ?Signed, ?Dorris Carnes, MD  ?09/08/2021 11:16 PM    ?[facility] ?[address], [place], [place]  [address] ?Phone: [phone]; Fax: [phone]  ? ? ?

## 2021-09-08 ENCOUNTER — Ambulatory Visit: Payer: Medicare Other | Admitting: Internal Medicine

## 2021-09-08 ENCOUNTER — Encounter: Payer: Self-pay | Admitting: Internal Medicine

## 2021-09-08 ENCOUNTER — Telehealth: Payer: Self-pay | Admitting: *Deleted

## 2021-09-08 VITALS — BP 148/90 | HR 55 | Ht 68.0 in | Wt 171.2 lb

## 2021-09-08 DIAGNOSIS — I2510 Atherosclerotic heart disease of native coronary artery without angina pectoris: Secondary | ICD-10-CM

## 2021-09-08 NOTE — Telephone Encounter (Signed)
Dr Harrington Challenger would like patient to decrease aspirin to 81 mg by mouth daily.  I placed call to patient and left message to call office.  ?

## 2021-09-08 NOTE — Patient Instructions (Signed)
Medication Instructions:  ?Your physician recommends that you continue on your current medications as directed. Please refer to the Current Medication list given to you today. ? ?*If you need a refill on your cardiac medications before your next appointment, please call your pharmacy* ? ? ?Lab Work: ?none ?If you have labs (blood work) drawn today and your tests are completely normal, you will receive your results only by: ?MyChart Message (if you have MyChart) OR ?A paper copy in the mail ?If you have any lab test that is abnormal or we need to change your treatment, we will call you to review the results. ? ? ?Testing/Procedures: ?none ? ? ?Follow-Up: ?At [facility], you and your health needs are our priority.  As part of our continuing mission to provide you with exceptional heart care, we have created designated Provider Care Teams.  These Care Teams include your primary Cardiologist (physician) and Advanced Practice Providers (APPs -  Physician Assistants and Nurse Practitioners) who all work together to provide you with the care you need, when you need it. ? ?We recommend signing up for the patient portal called "MyChart".  Sign up information is provided on this After Visit Summary.  MyChart is used to connect with patients for Virtual Visits (Telemedicine).  Patients are able to view lab/test results, encounter notes, upcoming appointments, etc.  Non-urgent messages can be sent to your provider as well.   ?To learn more about what you can do with MyChart, go to [email or web address].   ? ?Your next appointment:   ?12 month(s) ? ?The format for your next appointment:   ?In Person ? ?Provider:   ?Dorris Carnes, MD   ? ? ?Other Instructions ?  ? ?

## 2021-09-09 MED ORDER — ASPIRIN EC 81 MG PO TBEC: 81.0000 mg | DELAYED_RELEASE_TABLET | Freq: Every day | ORAL | Status: AC

## 2021-09-09 NOTE — Telephone Encounter (Signed)
Pt returning phone call from nurse. Please advise ? ?

## 2021-09-09 NOTE — Telephone Encounter (Signed)
Discussed Dr. Harrington Challenger' recommendation with patient. ? ?Dr Harrington Challenger would like patient to decrease aspirin to 81 mg by mouth daily. ? ?Patient verbalized understanding. Medication profile updated. ?

## 2021-09-13 ENCOUNTER — Other Ambulatory Visit: Payer: Self-pay | Admitting: Internal Medicine

## 2021-12-12 ENCOUNTER — Ambulatory Visit: Payer: Self-pay | Admitting: Surgery

## 2021-12-12 DIAGNOSIS — K642 Third degree hemorrhoids: Secondary | ICD-10-CM | POA: Insufficient documentation

## 2021-12-12 DIAGNOSIS — Z86010 Personal history of colonic polyps: Secondary | ICD-10-CM | POA: Insufficient documentation

## 2021-12-12 DIAGNOSIS — H833X9 Noise effects on inner ear, unspecified ear: Secondary | ICD-10-CM | POA: Insufficient documentation

## 2021-12-12 DIAGNOSIS — K643 Fourth degree hemorrhoids: Secondary | ICD-10-CM | POA: Insufficient documentation

## 2021-12-12 DIAGNOSIS — Z8546 Personal history of malignant neoplasm of prostate: Secondary | ICD-10-CM | POA: Insufficient documentation

## 2021-12-12 DIAGNOSIS — K429 Umbilical hernia without obstruction or gangrene: Secondary | ICD-10-CM | POA: Insufficient documentation

## 2021-12-13 ENCOUNTER — Telehealth: Payer: Self-pay | Admitting: *Deleted

## 2021-12-13 NOTE — Telephone Encounter (Signed)
   Pre-operative Risk Assessment    Patient Name: [name] [name]  DOB: [birth year] MRN: [id number]      Request for Surgical Clearance    Procedure:   HEMORRHOIDECTOMY   Date of Surgery:  Clearance TBD                                 Surgeon:  DR. [name] Surgeon's Group or Practice Name:  [facility] Phone number:  [phone] Fax number:  [phone] ATTN:  Illene Regulus   Type of Clearance Requested:   - Medical  - Pharmacy:  Hold Aspirin NOT INDICATED ON FAX   Type of Anesthesia:  General    Additional requests/questions:    Astrid Divine   12/13/2021, 8:07 AM

## 2021-12-13 NOTE — Telephone Encounter (Signed)
Primary Cardiologist:Paula Harrington Challenger, MD   Preoperative team, please contact this patient and set up a phone call appointment for further preoperative risk assessment. Please obtain consent and complete medication review. Thank you for your help.   I confirm that guidance regarding antiplatelet and oral anticoagulation therapy has been completed and, if necessary, noted below.  Emmaline Life, NP-C    12/13/2021, 12:51 PM [facility] [address] Office [phone] Fax [phone]

## 2021-12-14 NOTE — Telephone Encounter (Signed)
1st attempt to reach patient to setup phone visit for clearance. Lvm

## 2021-12-15 NOTE — Telephone Encounter (Signed)
Left message for the pt to call back for tele pre op appt.  ?

## 2021-12-19 NOTE — Telephone Encounter (Signed)
Left message x 3 to call for tele visit for pre op clearance appt. I will send pt a letter to call the office for tele appt. I will update the requesting office the pt needs a tele visit for pre op clearance though we have not been able to reach the pt to set up appt.   I will remove from the call back and re-address once the pt calls back for tele appt.

## 2021-12-20 ENCOUNTER — Telehealth: Payer: Self-pay | Admitting: *Deleted

## 2021-12-20 NOTE — Telephone Encounter (Signed)
I was able to s/w the pt and he is agreeable to plan of care. Tele visit pre op clearance 12/27/21 @ 9:20. Med rec and consent are done.     Patient Consent for Virtual Visit        [name] [name] has provided verbal consent on 12/20/2021 for a virtual visit (video or telephone).   CONSENT FOR VIRTUAL VISIT FOR:  [name] [name]  By participating in this virtual visit I agree to the following:  I hereby voluntarily request, consent and authorize [facility] and its employed or contracted physicians, physician assistants, nurse practitioners or other licensed health care professionals (the Practitioner), to provide me with telemedicine health care services (the "Services") as deemed necessary by the treating Practitioner. I acknowledge and consent to receive the Services by the Practitioner via telemedicine. I understand that the telemedicine visit will involve communicating with the Practitioner through live audiovisual communication technology and the disclosure of certain medical information by electronic transmission. I acknowledge that I have been given the opportunity to request an in-person assessment or other available alternative prior to the telemedicine visit and am voluntarily participating in the telemedicine visit.  I understand that I have the right to withhold or withdraw my consent to the use of telemedicine in the course of my care at any time, without affecting my right to future care or treatment, and that the Practitioner or I may terminate the telemedicine visit at any time. I understand that I have the right to inspect all information obtained and/or recorded in the course of the telemedicine visit and may receive copies of available information for a reasonable fee.  I understand that some of the potential risks of receiving the Services via telemedicine include:  Delay or interruption in medical evaluation due to technological equipment failure or disruption; Information  transmitted may not be sufficient (e.g. poor resolution of images) to allow for appropriate medical decision making by the Practitioner; and/or  In rare instances, security protocols could fail, causing a breach of personal health information.  Furthermore, I acknowledge that it is my responsibility to provide information about my medical history, conditions and care that is complete and accurate to the best of my ability. I acknowledge that Practitioner's advice, recommendations, and/or decision may be based on factors not within their control, such as incomplete or inaccurate data provided by me or distortions of diagnostic images or specimens that may result from electronic transmissions. I understand that the practice of medicine is not an exact science and that Practitioner makes no warranties or guarantees regarding treatment outcomes. I acknowledge that a copy of this consent can be made available to me via my patient portal (Center), or I can request a printed copy by calling the office of [facility].    I understand that my insurance will be billed for this visit.   I have read or had this consent read to me. I understand the contents of this consent, which adequately explains the benefits and risks of the Services being provided via telemedicine.  I have been provided ample opportunity to ask questions regarding this consent and the Services and have had my questions answered to my satisfaction. I give my informed consent for the services to be provided through the use of telemedicine in my medical care

## 2021-12-20 NOTE — Telephone Encounter (Signed)
I was able to s/w the pt and he is agreeable to plan of care. Tele visit pre op clearance 12/27/21 @ 9:20. Med rec and consent are done.

## 2021-12-27 ENCOUNTER — Ambulatory Visit (INDEPENDENT_AMBULATORY_CARE_PROVIDER_SITE_OTHER): Payer: Medicare Other | Admitting: Nurse Practitioner

## 2021-12-27 DIAGNOSIS — Z01810 Encounter for preprocedural cardiovascular examination: Secondary | ICD-10-CM | POA: Diagnosis not present

## 2021-12-27 NOTE — Progress Notes (Signed)
Virtual Visit via Telephone Note   Because of [name] [name]'s co-morbid illnesses, he is at least at moderate risk for complications without adequate follow up.  This format is felt to be most appropriate for this patient at this time.  [name] patient did not have access to video technology/had technical difficulties with video requiring transitioning to audio format only (telephone).  All issues noted in this document were discussed and addressed.  No physical exam could be performed with this format.  Please refer to [name] patient's chart for his consent to telehealth for [name] [name].  Evaluation Performed:  Preoperative cardiovascular risk assessment _____________   Date:  12/27/2021   Patient ID:  [name] [name], DOB 05/13/1934, MRN [id number] Patient Location:  Home Provider location:   Office  Primary Care Provider:  Haywood [name], [name] [name] Primary Cardiologist:  [name] [name], [name] [name]  Chief Complaint / Patient Profile   88 y.o. y/o male with a h/o CAD s/p PTCA in 1991, hypertension, and hyperlipidemia who is pending hemorrhoidectomy, date TBD, with [name]. [name] [name] of [name] [name] and presents today for telephonic preoperative cardiovascular risk assessment.  Past Medical History    Past Medical History:  Diagnosis Date   BPH with obstruction/lower urinary tract symptoms    Coronary artery disease    CARDIOLOGIST-  [name] [name]   Diverticulosis of colon    ED (erectile dysfunction)    History of adenomatous polyp of colon    tubular adenom2005;  2008;  2015   History of MI (myocardial infarction)    1991 s/p angioplasty   History of prostate cancer urologist-  [name] [name]--  last PSA  0.09 (10/ 2016)   dx 2009--  s/p  radioactive seed implants 06-10-2008   History of recurrent cystitis    Hyperlipidemia    Hypertension    Hypogonadism, testicular    Incomplete emptying of bladder    Urethral stricture    Urethral stricture    Wears contact lenses    Wears  partial dentures    upper   Past Surgical History:  Procedure Laterality Date   CARDIOVASCULAR STRESS TEST  09-21-2015   [name] [name] [name]   Low risk nuclear study w/ small defect of mild severity in apex , no ischemia /  normal LV function and wall motion , ef 56%   CORONARY ANGIOPLASTY  1991   CYSTO/  REMOVAL OF OBJECT     CYSTOSCOPY WITH URETHRAL DILATATION  06/10/2012   Procedure: CYSTOSCOPY WITH URETHRAL DILATATION;  Surgeon: [name] [name], [name] [name];  Location: [name];  Service: Urology;  Laterality: N/A;   CYSTOSCOPY WITH URETHRAL DILATATION N/A 12/15/2015   Procedure: CYSTOSCOPY WITH URETHRA L BALLOON  DILATATION;  Surgeon: [name] [name], [name] [name];  Location: [name];  Service: Urology;  Laterality: N/A;   GREEN LIGHT LASER TURP (TRANSURETHRAL RESECTION OF PROSTATE  2012 approx.   RADIOACTIVE PROSTATE SEED IMPLANTS  06-10-2008    Allergies  Allergies  Allergen Reactions   Sulfa Antibiotics Rash   Sulfasalazine Rash   Sulfamethoxazole-Trimethoprim Other (See Comments)   Sulfamethoxazole Rash    History of Present Illness    [name] [name] is a 88 y.o. male who presents via audio/video conferencing for a telehealth visit today.  Pt was last seen in cardiology clinic on 09/08/2021 by [name] [name].  At that time [name] [name] was doing well. [name] patient is now pending procedure as outlined above. Since his last visit, he  has done well from a cardiac standpoint. He denies chest pain, palpitations, dyspnea, pnd, orthopnea, n, v, dizziness, syncope, edema, weight gain, or early satiety. All other systems reviewed and are otherwise negative except as noted above.   Home Medications    Prior to Admission medications   Medication Sig Start Date End Date Taking? Authorizing Provider  ANDROGEL PUMP 20.25 MG/ACT (1.62%) GEL daily. 2 pumps daily 09/13/15   Provider, Historical, [name] [name]  ascorbic acid (VITAMIN C) 500 MG tablet Take 500 mg by mouth daily. Patient not taking:  Reported on 09/08/2021    Provider, Historical, [name] [name]  aspirin EC 81 MG tablet Take 1 tablet (81 mg total) by mouth daily. Swallow whole. 09/09/21   [name] [name], [name] [name]  atorvastatin (LIPITOR) 20 MG tablet Take 1 tablet (20 mg total) by mouth daily at 6 PM. 09/13/21   [name] [name], [name] [name]  Cholecalciferol 25 MCG (1000 UT) tablet Take 1 tablet by mouth daily. Patient not taking: Reported on 09/08/2021    Provider, Historical, [name] [name]  Folic Acid 0.8 MG CAPS  04/19/20   Provider, Historical, [name] [name]  latanoprost (XALATAN) 0.005 % ophthalmic solution Place 1 drop into both eyes at bedtime.    Provider, Historical, [name] [name]  losartan (COZAAR) 100 MG tablet Take 1 tablet (100 mg total) by mouth daily. 09/24/15   [name] [name], [name] [name]  tadalafil (CIALIS) 5 MG tablet Take 5 mg by mouth every evening.     Provider, Historical, [name] [name]  tamsulosin (FLOMAX) 0.4 MG CAPS capsule Take 0.4 mg by mouth daily.    Provider, Historical, [name] [name]  timolol (TIMOPTIC) 0.5 % ophthalmic solution Place 1 drop into both eyes daily.    Provider, Historical, [name] [name]  vitamin B-12 (CYANOCOBALAMIN) 1000 MCG tablet Take 1,000 mcg by mouth daily. Patient not taking: Reported on 09/08/2021    Provider, Historical, [name] [name]    Physical Exam    Vital Signs:  [name] [name] does not have vital signs available for review today.  Given telephonic nature of communication, physical exam is limited. AAOx3. NAD. Normal affect.  Speech and respirations are unlabored.  Accessory Clinical Findings    None  Assessment & Plan    1.  Preoperative Cardiovascular Risk Assessment:  According to [name] Revised Cardiac Risk Index (RCRI), his Perioperative Risk of Major Cardiac Event is (%): 0.9. His Functional Capacity in METs is: 8.33 according to [name] Duke Activity Status Index (DASI).Therefore, based on ACC/AHA guidelines, patient would be at acceptable risk for [name] planned procedure without further cardiovascular testing.   If necessary, per office protocol, patient may hold aspirin for 7 days  prior to procedure.  Please resume aspirin as soon as possible postprocedure, [name] discretion of [name] surgeon.  A copy of this note will be routed to requesting surgeon.  Time:   Today, I have spent 7 minutes with [name] patient with telehealth technology discussing medical history, symptoms, and management plan.     Lenna Sciara, NP  12/27/2021, 9:31 AM

## 2022-04-06 DIAGNOSIS — H5022 Vertical strabismus, left eye: Secondary | ICD-10-CM | POA: Insufficient documentation

## 2022-04-25 ENCOUNTER — Encounter (HOSPITAL_BASED_OUTPATIENT_CLINIC_OR_DEPARTMENT_OTHER): Admission: RE | Payer: Self-pay | Source: Home / Self Care

## 2022-04-25 ENCOUNTER — Ambulatory Visit (HOSPITAL_BASED_OUTPATIENT_CLINIC_OR_DEPARTMENT_OTHER): Admission: RE | Admit: 2022-04-25 | Payer: Medicare Other | Source: Home / Self Care | Admitting: Surgery

## 2022-04-25 SURGERY — EXAM UNDER ANESTHESIA WITH HEMORRHOIDECTOMY
Anesthesia: General

## 2022-05-09 ENCOUNTER — Encounter: Payer: Self-pay | Admitting: Podiatry

## 2022-05-09 ENCOUNTER — Ambulatory Visit: Payer: Medicare Other | Admitting: Podiatry

## 2022-05-09 DIAGNOSIS — M722 Plantar fascial fibromatosis: Secondary | ICD-10-CM | POA: Diagnosis not present

## 2022-05-09 DIAGNOSIS — F439 Reaction to severe stress, unspecified: Secondary | ICD-10-CM | POA: Insufficient documentation

## 2022-05-09 MED ORDER — METHYLPREDNISOLONE 4 MG PO TBPK: ORAL_TABLET | ORAL | 0 refills | Status: DC

## 2022-05-09 NOTE — Patient Instructions (Signed)

## 2022-05-10 NOTE — Progress Notes (Signed)
  Subjective:  Patient ID: [name] [name], male    DOB: November 11, 1933,  MRN: [id number]  Chief Complaint  Patient presents with   Foot Pain    New Patient - bilateral heel pain - he has had plantar fasciitis in the past - pain scale right = 10, left = 4 He has had cortisone injections before, about 20 years ago, and it fixed the problem    88 y.o. male presents with the above complaint. History confirmed with patient.  The left is worse than the right  Objective:  Physical Exam: warm, good capillary refill, no trophic changes or ulcerative lesions, normal DP and PT pulses, and normal sensory exam. Left Foot: point tenderness over the heel pad Right Foot: point tenderness over the heel pad, less significant than the left side   Assessment:   1. Plantar fasciitis, bilateral      Plan:  Patient was evaluated and treated and all questions answered.  Discussed the etiology and treatment options for plantar fasciitis including stretching, formal physical therapy, supportive shoegears such as a running shoe or sneaker, pre fabricated orthoses, injection therapy, and oral medications. We also discussed the role of surgical treatment of this for patients who do not improve after exhausting non-surgical treatment options.   -Educated patient on stretching and icing of the affected limb -Rx for medrol pack. Educated on use, risks, and benefits of the medication -We also discussed a corticosteroid injection, and he declined this today and would like to work on physical therapy and prednisone taper first.  Discussed with him if its not improving within a few weeks to let me know to return to injection  Return if symptoms worsen or fail to improve.

## 2022-06-25 IMAGING — US US SOFT TISSUE
1 series · 14 of 16 positions shown · non-contrast
Comparison: None.

CLINICAL DATA: Disorder of soft tissue of neck. Palpable mass at
base of neck.

EXAM:
ULTRASOUND OF HEAD/NECK SOFT TISSUES
TECHNIQUE: Ultrasound examination of the head and neck soft tissues was
performed in the area of clinical concern.

[Series 1: us soft tissue · 0.07mm/px · 35 acquisitions, 14 frames shown]
[im 1/35]
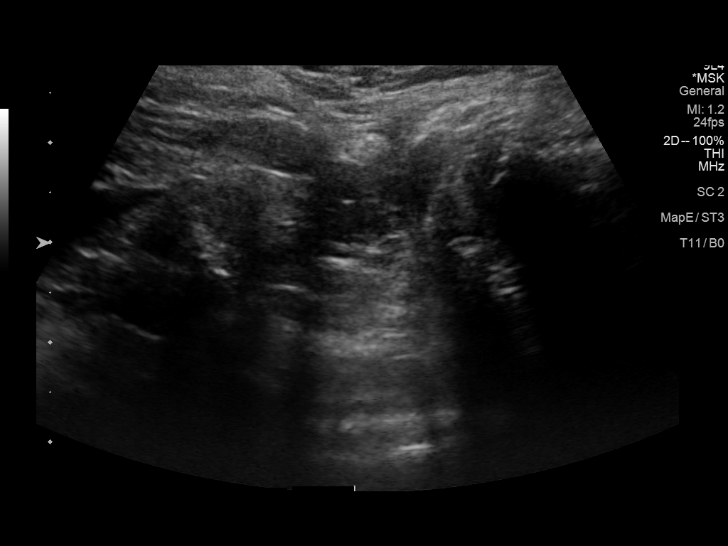
[im 3/35]
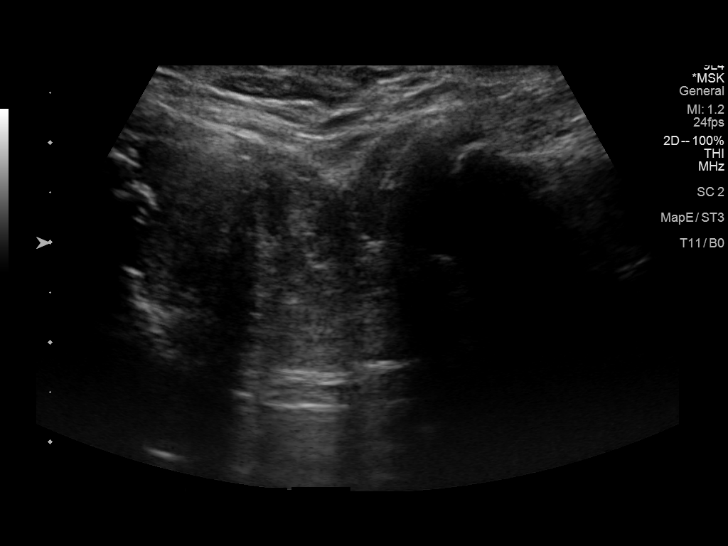
[im 5/35]
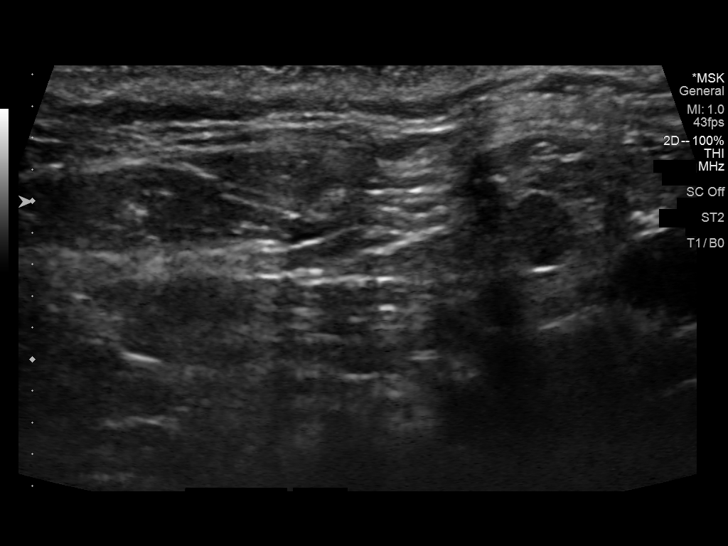
[im 10/35]
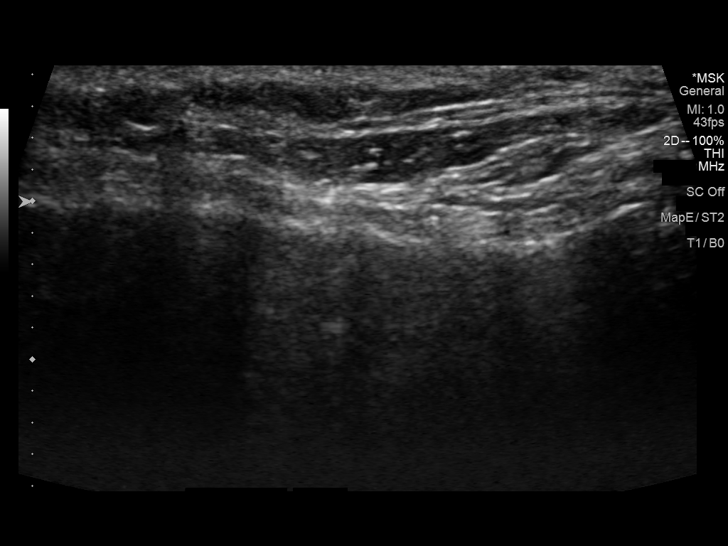
[im 12/35]
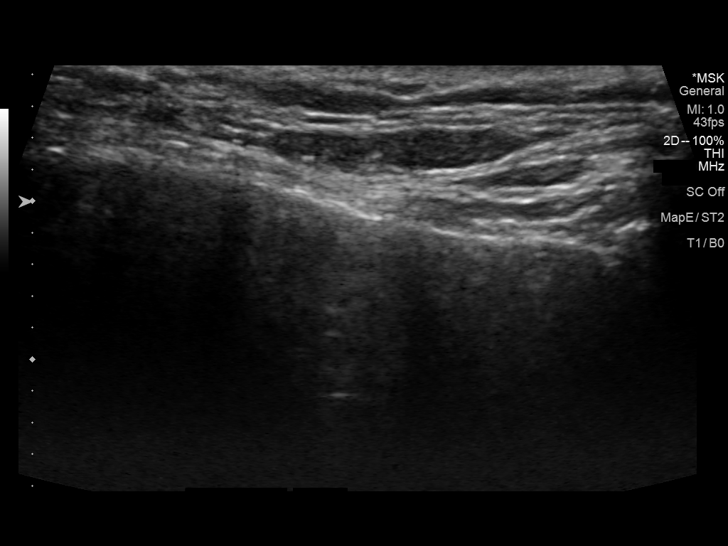
[im 14/35]
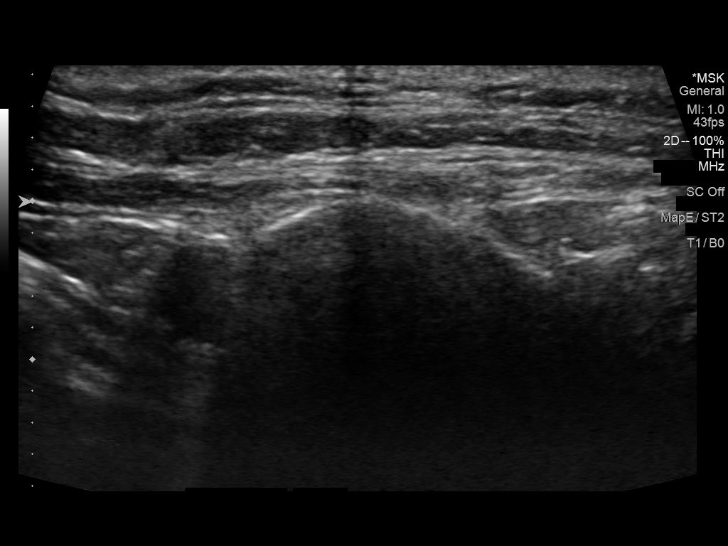
[im 16/35]
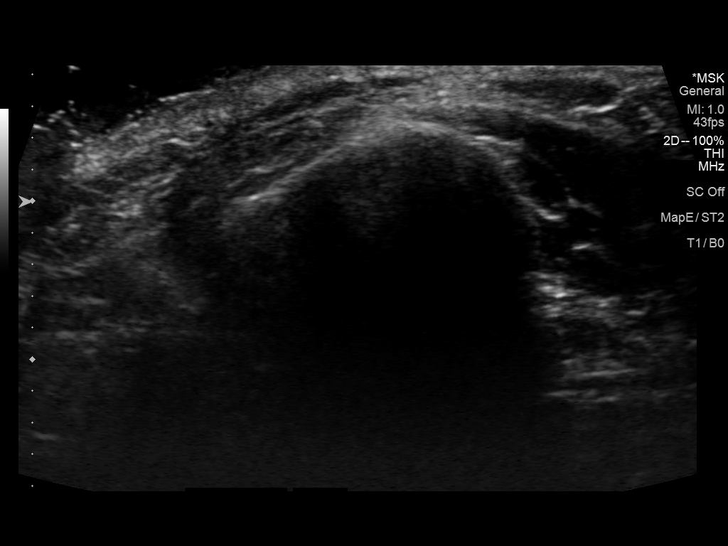
[im 19/35]
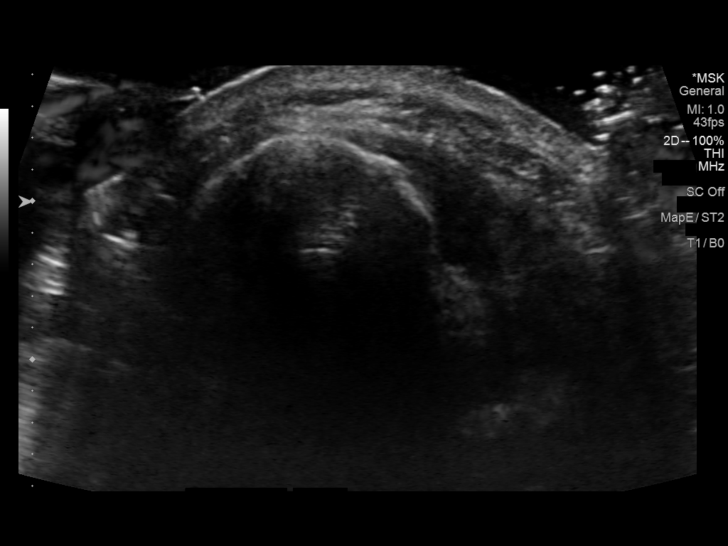
[im 21/35]
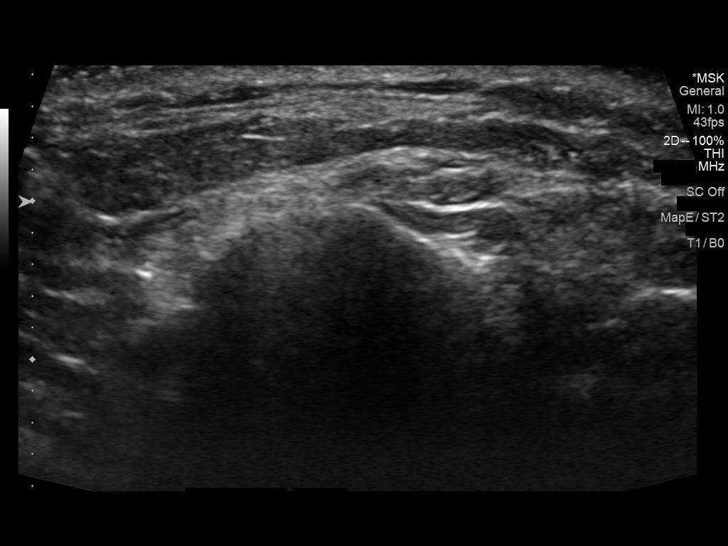
[im 23/35]
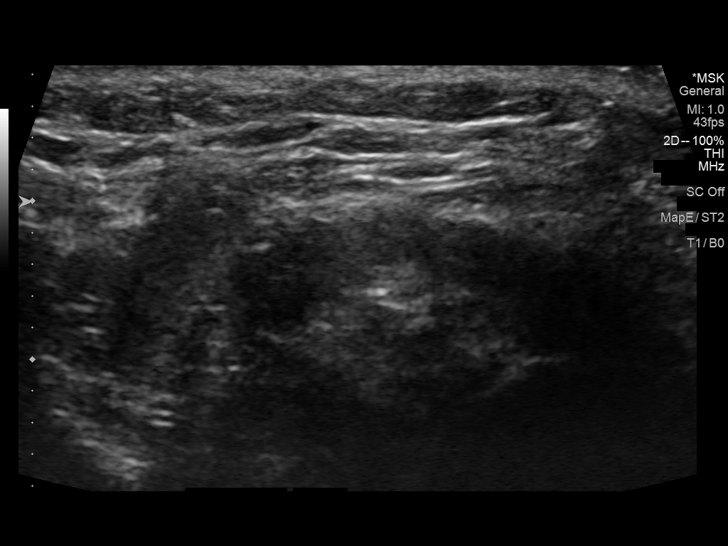
[im 28/35]
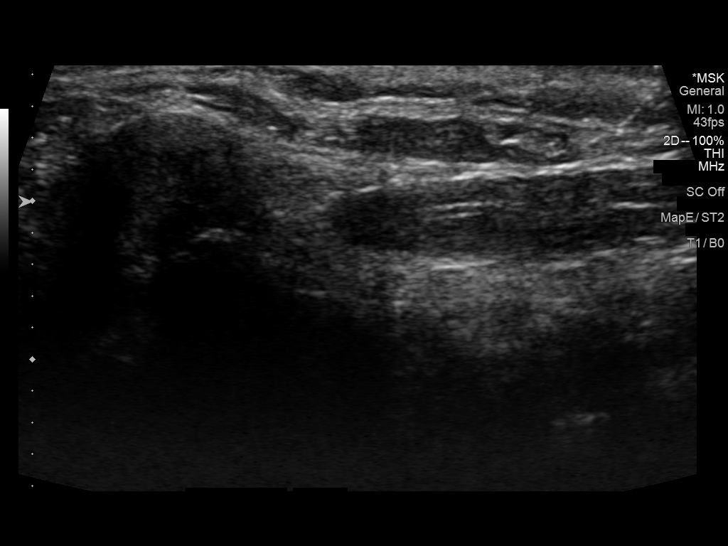
[im 30/35]
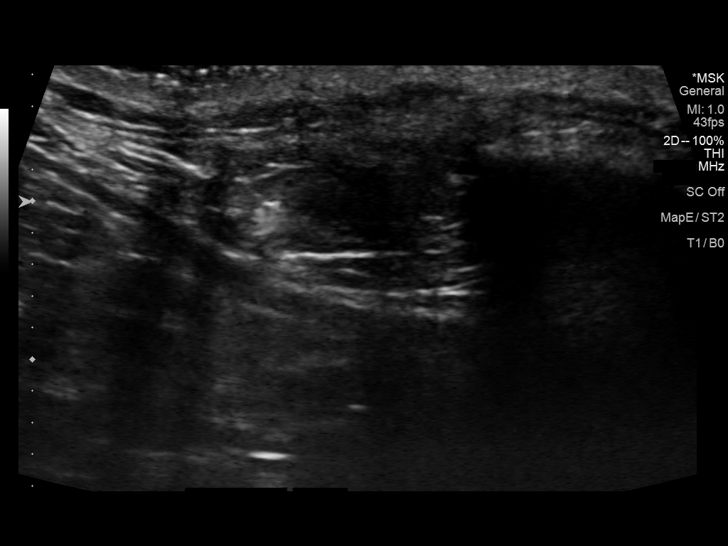
[im 32/35]
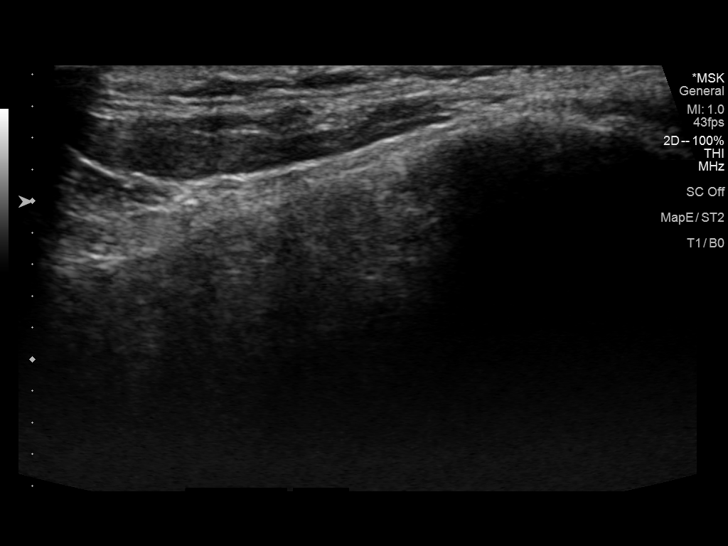
[im 35/35]
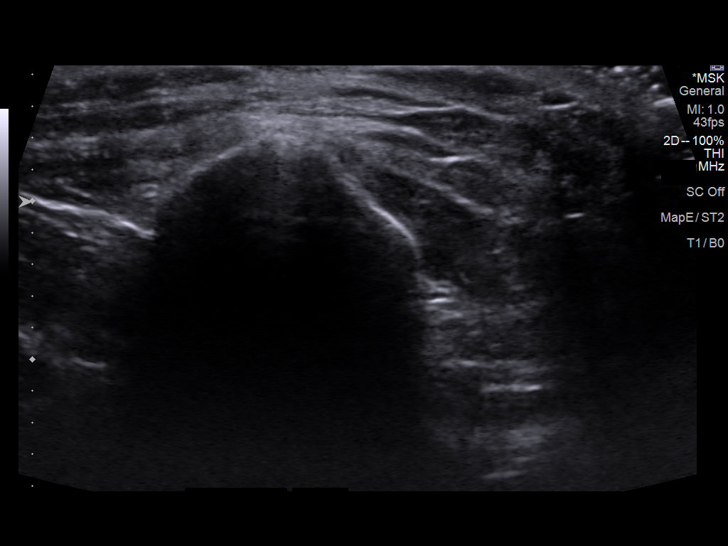

[14 of 16 positions shown; findings below may reference images not displayed]

FINDINGS: No soft tissue mass or cystic lesion identified bilaterally along
the base of neck/clavicular area.
IMPRESSION: No soft tissue mass or cystic lesion within the visualize base of
neck/clavicular regions bilaterally.

## 2022-09-10 ENCOUNTER — Other Ambulatory Visit: Payer: Self-pay | Admitting: Internal Medicine

## 2022-09-10 NOTE — Progress Notes (Unsigned)
Cardiology Office Note   Date:  09/10/2022   ID:  [name] [name], DOB 1934-01-07, MRN [id number]  PCP:  Gaspar Garbe, MD  Cardiologist:   Dietrich Pates, MD   F/U of CAD    History of Present Illness: [name] [name] is a 89 y.o. male with a history off HTN and CAD  Followed by Dr Wylene Simmer as his primary provider  He underwent  PTCA in 1991    Last myovue in 2017   Normal  LVEF 56%   I saw the pt in jan 2022  Since seen he has done well from a cardiac standpoint   Denies CP  No SOB Stiill under increased stress as wife has Parkinson's, lives at home   I saw the pt in April 2023    No outpatient medications have been marked as taking for the 09/14/22 encounter (Appointment) with Pricilla Riffle, MD.     Allergies:   Sulfa antibiotics, Sulfasalazine, Sulfamethoxazole-trimethoprim, and Sulfamethoxazole   Past Medical History:  Diagnosis Date   BPH with obstruction/lower urinary tract symptoms    Coronary artery disease    CARDIOLOGIST-  DR Gunnar Fusi Samora Jernberg   Diverticulosis of colon    ED (erectile dysfunction)    History of adenomatous polyp of colon    tubular adenom2005;  2008;  2015   History of MI (myocardial infarction)    1991 s/p angioplasty   History of prostate cancer urologist-  dr Isabel Caprice--  last PSA  0.09 (10/ 2016)   dx 2009--  s/p  radioactive seed implants 06-10-2008   History of recurrent cystitis    Hyperlipidemia    Hypertension    Hypogonadism, testicular    Incomplete emptying of bladder    Urethral stricture    Urethral stricture    Wears contact lenses    Wears partial dentures    upper    Past Surgical History:  Procedure Laterality Date   CARDIOVASCULAR STRESS TEST  09-21-2015   dr Dietrich Pates   Low risk nuclear study w/ small defect of mild severity in apex , no ischemia /  normal LV function and wall motion , ef 56%   CORONARY ANGIOPLASTY  1991   CYSTO/  REMOVAL OF OBJECT     CYSTOSCOPY WITH URETHRAL DILATATION  06/10/2012   Procedure:  CYSTOSCOPY WITH URETHRAL DILATATION;  Surgeon: Valetta Fuller, MD;  Location: [facility];  Service: Urology;  Laterality: N/A;   CYSTOSCOPY WITH URETHRAL DILATATION N/A 12/15/2015   Procedure: CYSTOSCOPY WITH URETHRA L BALLOON  DILATATION;  Surgeon: Barron Alvine, MD;  Location: [facility];  Service: Urology;  Laterality: N/A;   GREEN LIGHT LASER TURP (TRANSURETHRAL RESECTION OF PROSTATE  2012 approx.   RADIOACTIVE PROSTATE SEED IMPLANTS  06-10-2008     Social History:  The patient  reports that he quit smoking about 56 years ago. His smoking use included cigarettes. He has a 8.00 pack-year smoking history. He has never used smokeless tobacco. He reports current alcohol use of about 2.0 standard drinks of alcohol per week. He reports that he does not use drugs.   Family History:  The patient's family history includes Cancer in an other family member; Dementia in his mother; Heart disease in an other family member; Hypertension in an other family member; Parkinsonism in his brother.    ROS:  Please see the history of present illness. All other systems are reviewed and  Negative to the above problem except as  noted.    PHYSICAL EXAM: VS:  There were no vitals taken for this visit.  GEN: Well nourished, well developed, in no acute distress  HEENT: normal  Neck: JVP is normla   No bruits    Cardiac: RRR; no murmurs  No LE edema  Respiratory:  clear to auscultation bilaterally, GI: soft, nontender, nondistended, + BS  No hepatomegaly  MS: no deformity Moving all extremities   Skin: warm and dry, no rash Neuro:  Strength and sensation are intact Psych: euthymic mood, full affect   EKG:  EKG is  ordered today.  SB 55 bpm     Lipid Panel    Component Value Date/Time   CHOL 139 04/25/2017 0803   TRIG 117 04/25/2017 0803   HDL 56 04/25/2017 0803   CHOLHDL 2.5 04/25/2017 0803   LDLCALC 60 04/25/2017 0803      Wt Readings from Last 3 Encounters:   09/08/21 171 lb 3.2 oz (77.7 kg)  11/18/20 173 lb 9.6 oz (78.7 kg)  06/29/20 175 lb 3.2 oz (79.5 kg)      ASSESSMENT AND PLAN:  1  CAD   Doing good  Remains symptom free   2  HL   tLIpids from March 2022 are excellent   Will contact Dr Irean Hong office for more recent labs   3  HTN  BP here is high  Log from home shows BP in 120s to 130s  Consistent with white coat HTN  Plan for f/u in 1 year.  Sooner for problems     Current medicines are reviewed at length with the patient today.  The patient does not have concerns regarding medicines.  Signed, Dietrich Pates, MD  09/10/2022 8:57 AM    [facility] [address] [place], [place], [place]  [address] Phone: [phone]; Fax: [phone]

## 2022-09-11 NOTE — Telephone Encounter (Signed)
Rx refill sent to pharmacy. 

## 2022-09-14 ENCOUNTER — Encounter: Payer: Self-pay | Admitting: Internal Medicine

## 2022-09-14 ENCOUNTER — Ambulatory Visit: Payer: Medicare Other | Attending: Internal Medicine | Admitting: Internal Medicine

## 2022-09-14 VITALS — BP 152/80 | HR 55 | Ht 68.0 in | Wt 171.0 lb

## 2022-09-14 DIAGNOSIS — I2510 Atherosclerotic heart disease of native coronary artery without angina pectoris: Secondary | ICD-10-CM

## 2022-09-14 NOTE — Patient Instructions (Signed)
Medication Instructions:   *If you need a refill on your cardiac medications before your next appointment, please call your pharmacy*   Lab Work:  If you have labs (blood work) drawn today and your tests are completely normal, you will receive your results only by: MyChart Message (if you have MyChart) OR A paper copy in the mail If you have any lab test that is abnormal or we need to change your treatment, we will call you to review the results.   Testing/Procedures:    Follow-Up: At [facility], you and your health needs are our priority.  As part of our continuing mission to provide you with exceptional heart care, we have created designated Provider Care Teams.  These Care Teams include your primary Cardiologist (physician) and Advanced Practice Providers (APPs -  Physician Assistants and Nurse Practitioners) who all work together to provide you with the care you need, when you need it.  We recommend signing up for the patient portal called "MyChart".  Sign up information is provided on this After Visit Summary.  MyChart is used to connect with patients for Virtual Visits (Telemedicine).  Patients are able to view lab/test results, encounter notes, upcoming appointments, etc.  Non-urgent messages can be sent to your provider as well.   To learn more about what you can do with MyChart, go to [email or web address].     Provider:   Dietrich Pates, MD     Other Instructions

## 2022-12-08 ENCOUNTER — Other Ambulatory Visit: Payer: Self-pay | Admitting: Internal Medicine

## 2023-12-16 ENCOUNTER — Other Ambulatory Visit: Payer: Self-pay | Admitting: Internal Medicine

## 2024-01-30 ENCOUNTER — Encounter (HOSPITAL_COMMUNITY)
Admission: EM | Disposition: A | Discharge status: Other Acute Inpatient Hospital | Payer: Self-pay | Source: Home / Self Care

## 2024-01-30 ENCOUNTER — Inpatient Hospital Stay (HOSPITAL_COMMUNITY)

## 2024-01-30 ENCOUNTER — Other Ambulatory Visit: Payer: Self-pay

## 2024-01-30 ENCOUNTER — Inpatient Hospital Stay (HOSPITAL_COMMUNITY)
Admission: EM | Admit: 2024-01-30 | Discharge: 2024-02-23 | DRG: 140 | Disposition: A | Discharge status: Other Acute Inpatient Hospital | Attending: General Surgery | Admitting: General Surgery

## 2024-01-30 ENCOUNTER — Emergency Department (HOSPITAL_COMMUNITY)

## 2024-01-30 ENCOUNTER — Encounter (HOSPITAL_COMMUNITY): Payer: Self-pay

## 2024-01-30 DIAGNOSIS — S0500XA Injury of conjunctiva and corneal abrasion without foreign body, unspecified eye, initial encounter: Secondary | ICD-10-CM | POA: Diagnosis present

## 2024-01-30 DIAGNOSIS — H02155 Paralytic ectropion of left lower eyelid: Secondary | ICD-10-CM | POA: Diagnosis present

## 2024-01-30 DIAGNOSIS — R197 Diarrhea, unspecified: Secondary | ICD-10-CM | POA: Diagnosis not present

## 2024-01-30 DIAGNOSIS — S01312A Laceration without foreign body of left ear, initial encounter: Secondary | ICD-10-CM | POA: Diagnosis present

## 2024-01-30 DIAGNOSIS — Z91148 Patient's other noncompliance with medication regimen for other reason: Secondary | ICD-10-CM

## 2024-01-30 DIAGNOSIS — W3400XA Accidental discharge from unspecified firearms or gun, initial encounter: Principal | ICD-10-CM

## 2024-01-30 DIAGNOSIS — R338 Other retention of urine: Secondary | ICD-10-CM | POA: Diagnosis present

## 2024-01-30 DIAGNOSIS — S02622A Fracture of subcondylar process of left mandible, initial encounter for closed fracture: Secondary | ICD-10-CM | POA: Diagnosis present

## 2024-01-30 DIAGNOSIS — G4700 Insomnia, unspecified: Secondary | ICD-10-CM | POA: Diagnosis present

## 2024-01-30 DIAGNOSIS — E46 Unspecified protein-calorie malnutrition: Secondary | ICD-10-CM | POA: Diagnosis present

## 2024-01-30 DIAGNOSIS — S02602A Fracture of unspecified part of body of left mandible, initial encounter for closed fracture: Secondary | ICD-10-CM

## 2024-01-30 DIAGNOSIS — H02235 Paralytic lagophthalmos left lower eyelid: Secondary | ICD-10-CM | POA: Diagnosis present

## 2024-01-30 DIAGNOSIS — Z23 Encounter for immunization: Secondary | ICD-10-CM

## 2024-01-30 DIAGNOSIS — Z6826 Body mass index (BMI) 26.0-26.9, adult: Secondary | ICD-10-CM

## 2024-01-30 DIAGNOSIS — T43226A Underdosing of selective serotonin reuptake inhibitors, initial encounter: Secondary | ICD-10-CM | POA: Diagnosis present

## 2024-01-30 DIAGNOSIS — I2510 Atherosclerotic heart disease of native coronary artery without angina pectoris: Secondary | ICD-10-CM | POA: Diagnosis present

## 2024-01-30 DIAGNOSIS — F0394 Unspecified dementia, unspecified severity, with anxiety: Secondary | ICD-10-CM | POA: Diagnosis present

## 2024-01-30 DIAGNOSIS — F03918 Unspecified dementia, unspecified severity, with other behavioral disturbance: Secondary | ICD-10-CM | POA: Diagnosis present

## 2024-01-30 DIAGNOSIS — Z8546 Personal history of malignant neoplasm of prostate: Secondary | ICD-10-CM

## 2024-01-30 DIAGNOSIS — R339 Retention of urine, unspecified: Secondary | ICD-10-CM | POA: Diagnosis not present

## 2024-01-30 DIAGNOSIS — G510 Bell's palsy: Secondary | ICD-10-CM | POA: Diagnosis present

## 2024-01-30 DIAGNOSIS — N401 Enlarged prostate with lower urinary tract symptoms: Secondary | ICD-10-CM | POA: Diagnosis present

## 2024-01-30 DIAGNOSIS — D62 Acute posthemorrhagic anemia: Secondary | ICD-10-CM | POA: Diagnosis not present

## 2024-01-30 DIAGNOSIS — F0393 Unspecified dementia, unspecified severity, with mood disturbance: Secondary | ICD-10-CM | POA: Diagnosis present

## 2024-01-30 DIAGNOSIS — S02652A Fracture of angle of left mandible, initial encounter for closed fracture: Principal | ICD-10-CM | POA: Diagnosis present

## 2024-01-30 DIAGNOSIS — H189 Unspecified disorder of cornea: Secondary | ICD-10-CM | POA: Diagnosis present

## 2024-01-30 DIAGNOSIS — R6889 Other general symptoms and signs: Secondary | ICD-10-CM | POA: Diagnosis not present

## 2024-01-30 DIAGNOSIS — S02642A Fracture of ramus of left mandible, initial encounter for closed fracture: Secondary | ICD-10-CM | POA: Diagnosis present

## 2024-01-30 DIAGNOSIS — R1312 Dysphagia, oropharyngeal phase: Secondary | ICD-10-CM | POA: Diagnosis not present

## 2024-01-30 DIAGNOSIS — X749XXA Intentional self-harm by unspecified firearm discharge, initial encounter: Secondary | ICD-10-CM

## 2024-01-30 DIAGNOSIS — J9601 Acute respiratory failure with hypoxia: Secondary | ICD-10-CM | POA: Diagnosis present

## 2024-01-30 DIAGNOSIS — S08122A Partial traumatic amputation of left ear, initial encounter: Secondary | ICD-10-CM | POA: Diagnosis present

## 2024-01-30 DIAGNOSIS — F332 Major depressive disorder, recurrent severe without psychotic features: Secondary | ICD-10-CM | POA: Diagnosis present

## 2024-01-30 DIAGNOSIS — I10 Essential (primary) hypertension: Secondary | ICD-10-CM | POA: Diagnosis present

## 2024-01-30 DIAGNOSIS — H02105 Unspecified ectropion of left lower eyelid: Secondary | ICD-10-CM | POA: Diagnosis present

## 2024-01-30 DIAGNOSIS — Z79899 Other long term (current) drug therapy: Secondary | ICD-10-CM

## 2024-01-30 DIAGNOSIS — E785 Hyperlipidemia, unspecified: Secondary | ICD-10-CM | POA: Diagnosis present

## 2024-01-30 DIAGNOSIS — H903 Sensorineural hearing loss, bilateral: Secondary | ICD-10-CM | POA: Diagnosis present

## 2024-01-30 DIAGNOSIS — Y249XXA Unspecified firearm discharge, undetermined intent, initial encounter: Secondary | ICD-10-CM

## 2024-01-30 DIAGNOSIS — F329 Major depressive disorder, single episode, unspecified: Secondary | ICD-10-CM | POA: Diagnosis not present

## 2024-01-30 DIAGNOSIS — R45851 Suicidal ideations: Secondary | ICD-10-CM | POA: Diagnosis not present

## 2024-01-30 DIAGNOSIS — F319 Bipolar disorder, unspecified: Secondary | ICD-10-CM | POA: Diagnosis not present

## 2024-01-30 HISTORY — DX: Depression, unspecified: F32.A

## 2024-01-30 HISTORY — DX: Sensorineural hearing loss, bilateral: H90.3

## 2024-01-30 HISTORY — DX: Malignant neoplasm of prostate: C61

## 2024-01-30 HISTORY — DX: Bipolar disorder, current episode depressed, moderate: F31.32

## 2024-01-30 HISTORY — DX: Benign prostatic hyperplasia without lower urinary tract symptoms: N40.0

## 2024-01-30 HISTORY — DX: Hyperlipidemia, unspecified: E78.5

## 2024-01-30 HISTORY — DX: Unspecified urethral stricture, male, unspecified site: N35.919

## 2024-01-30 HISTORY — DX: Atherosclerotic heart disease of native coronary artery without angina pectoris: I25.10

## 2024-01-30 HISTORY — DX: Essential (primary) hypertension: I10

## 2024-01-30 HISTORY — PX: FACIAL LACERATION REPAIR: SHX6589

## 2024-01-30 HISTORY — PX: ORIF MANDIBULAR FRACTURE: SHX2127

## 2024-01-30 LAB — COMPREHENSIVE METABOLIC PANEL WITH GFR
ALT: 26 U/L (ref 0–44)
AST: 26 U/L (ref 15–41)
Albumin: 2.1 g/dL — ABNORMAL LOW (ref 3.5–5.0)
Alkaline Phosphatase: 29 U/L — ABNORMAL LOW (ref 38–126)
Anion gap: 7 (ref 5–15)
BUN: 15 mg/dL (ref 8–23)
CO2: 19 mmol/L — ABNORMAL LOW (ref 22–32)
Calcium: 7 mg/dL — ABNORMAL LOW (ref 8.9–10.3)
Chloride: 112 mmol/L — ABNORMAL HIGH (ref 98–111)
Creatinine, Ser: 0.94 mg/dL (ref 0.61–1.24)
GFR, Estimated: >60 mL/min (ref >60)
Glucose, Bld: 146 mg/dL — ABNORMAL HIGH (ref 70–99)
Potassium: 3.3 mmol/L — ABNORMAL LOW (ref 3.5–5.1)
Sodium: 138 mmol/L (ref 135–145)
Total Bilirubin: 0.8 mg/dL (ref 0.0–1.2)
Total Protein: 3.6 g/dL — ABNORMAL LOW (ref 6.5–8.1)

## 2024-01-30 LAB — URINALYSIS, ROUTINE W REFLEX MICROSCOPIC
Bilirubin Urine: NEGATIVE
Glucose, UA: NEGATIVE mg/dL
Ketones, ur: NEGATIVE mg/dL
Leukocytes,Ua: NEGATIVE
Nitrite: NEGATIVE
Protein, ur: NEGATIVE mg/dL
Specific Gravity, Urine: 1.019 (ref 1.005–1.030)
pH: 5 (ref 5.0–8.0)

## 2024-01-30 LAB — I-STAT ARTERIAL BLOOD GAS, ED
Acid-base deficit: 1 mmol/L (ref 0.0–2.0)
Bicarbonate: 23.2 mmol/L (ref 20.0–28.0)
Calcium, Ion: 1.29 mmol/L (ref 1.15–1.40)
HCT: 29 % — ABNORMAL LOW (ref 39.0–52.0)
Hemoglobin: 9.9 g/dL — ABNORMAL LOW (ref 13.0–17.0)
O2 Saturation: 100 %
Potassium: 3.5 mmol/L (ref 3.5–5.1)
Sodium: 139 mmol/L (ref 135–145)
TCO2: 24 mmol/L (ref 22–32)
pCO2 arterial: 37.7 mmHg (ref 32–48)
pH, Arterial: 7.397 (ref 7.35–7.45)
pO2, Arterial: 468 mmHg — ABNORMAL HIGH (ref 83–108)

## 2024-01-30 LAB — I-STAT CHEM 8, ED
BUN: 17 mg/dL (ref 8–23)
Calcium, Ion: 1.01 mmol/L — ABNORMAL LOW (ref 1.15–1.40)
Chloride: 108 mmol/L (ref 98–111)
Creatinine, Ser: 0.8 mg/dL (ref 0.61–1.24)
Glucose, Bld: 133 mg/dL — ABNORMAL HIGH (ref 70–99)
HCT: 22 % — ABNORMAL LOW (ref 39.0–52.0)
Hemoglobin: 7.5 g/dL — ABNORMAL LOW (ref 13.0–17.0)
Potassium: 3.1 mmol/L — ABNORMAL LOW (ref 3.5–5.1)
Sodium: 141 mmol/L (ref 135–145)
TCO2: 20 mmol/L — ABNORMAL LOW (ref 22–32)

## 2024-01-30 LAB — CBC
HCT: 33 % — ABNORMAL LOW (ref 39.0–52.0)
HCT: 30 % — ABNORMAL LOW (ref 39.0–52.0)
Hemoglobin: 10.4 g/dL — ABNORMAL LOW (ref 13.0–17.0)
Hemoglobin: 9.6 g/dL — ABNORMAL LOW (ref 13.0–17.0)
MCH: 29.6 pg (ref 26.0–34.0)
MCH: 30 pg (ref 26.0–34.0)
MCHC: 31.5 g/dL (ref 30.0–36.0)
MCHC: 32 g/dL (ref 30.0–36.0)
MCV: 94 fL (ref 80.0–100.0)
MCV: 93.8 fL (ref 80.0–100.0)
Platelets: 181 K/uL (ref 150–400)
Platelets: 168 K/uL (ref 150–400)
RBC: 3.51 MIL/uL — ABNORMAL LOW (ref 4.22–5.81)
RBC: 3.2 MIL/uL — ABNORMAL LOW (ref 4.22–5.81)
RDW: 13.5 % (ref 11.5–15.5)
RDW: 13.7 % (ref 11.5–15.5)
WBC: 16.7 K/uL — ABNORMAL HIGH (ref 4.0–10.5)
WBC: 18 K/uL — ABNORMAL HIGH (ref 4.0–10.5)
nRBC: 0 % (ref 0.0–0.2)
nRBC: 0 % (ref 0.0–0.2)

## 2024-01-30 LAB — PROTIME-INR
INR: 1.4 — ABNORMAL HIGH (ref 0.8–1.2)
Prothrombin Time: 17.6 s — ABNORMAL HIGH (ref 11.4–15.2)

## 2024-01-30 LAB — PREPARE RBC (CROSSMATCH)

## 2024-01-30 LAB — I-STAT CG4 LACTIC ACID, ED: Lactic Acid, Venous: 2.7 mmol/L (ref 0.5–1.9)

## 2024-01-30 LAB — ETHANOL: Alcohol, Ethyl (B): <15 mg/dL (ref <15)

## 2024-01-30 LAB — SAMPLE TO BLOOD BANK

## 2024-01-30 LAB — ABO/RH: ABO/RH(D): O NEG

## 2024-01-30 SURGERY — OPEN REDUCTION INTERNAL FIXATION (ORIF) MANDIBULAR FRACTURE
Anesthesia: General | Site: Face | Laterality: Left

## 2024-01-30 MED ORDER — PANTOPRAZOLE SODIUM 40 MG PO TBEC
40.0000 mg | DELAYED_RELEASE_TABLET | Freq: Every day | ORAL | Status: DC
Start: 2024-01-30 — End: 2024-02-11
  Administered 2024-02-02 – 2024-02-10 (×6): 40 mg via ORAL
  Filled 2024-01-30 (×4): qty 1

## 2024-01-30 MED ORDER — PROPOFOL 10 MG/ML IV BOLUS
INTRAVENOUS | Status: AC
Start: 2024-01-30 — End: 2024-01-30
  Filled 2024-01-30: qty 20

## 2024-01-30 MED ORDER — ONDANSETRON 4 MG PO TBDP
4.0000 mg | ORAL_TABLET | Freq: Four times a day (QID) | ORAL | Status: DC | PRN
Start: 2024-01-30 — End: 2024-02-23
  Filled 2024-01-30: qty 1

## 2024-01-30 MED ORDER — FENTANYL CITRATE (PF) 250 MCG/5ML IJ SOLN
INTRAMUSCULAR | Status: AC
Start: 2024-01-30 — End: 2024-01-30
  Filled 2024-01-30: qty 5

## 2024-01-30 MED ORDER — HEMOSTATIC AGENTS (NO CHARGE) OPTIME
TOPICAL | Status: DC | PRN
Start: 2024-01-30 — End: 2024-01-30
  Administered 2024-01-30 (×2): 1 via TOPICAL

## 2024-01-30 MED ORDER — ALBUMIN HUMAN 5 % IV SOLN: INTRAVENOUS | Status: DC | PRN

## 2024-01-30 MED ORDER — LIDOCAINE-EPINEPHRINE 1 %-1:100000 IJ SOLN
INTRAMUSCULAR | Status: AC
  Filled 2024-01-30: qty 1

## 2024-01-30 MED ORDER — ACETAMINOPHEN 650 MG RE SUPP
650.0000 mg | Freq: Four times a day (QID) | RECTAL | Status: DC | PRN
  Administered 2024-01-31 – 2024-02-01 (×4): 650 mg via RECTAL
  Filled 2024-01-30 (×2): qty 1

## 2024-01-30 MED ORDER — CHLORHEXIDINE GLUCONATE CLOTH 2 % EX PADS
6.0000 | MEDICATED_PAD | Freq: Every day | CUTANEOUS | Status: DC
  Administered 2024-01-30 – 2024-02-14 (×28): 6 via TOPICAL

## 2024-01-30 MED ORDER — POLYETHYLENE GLYCOL 3350 17 G PO PACK
17.0000 g | PACK | Freq: Every day | ORAL | Status: DC
  Administered 2024-01-31 (×2): 17 g
  Filled 2024-01-30: qty 1

## 2024-01-30 MED ORDER — HYDRALAZINE HCL 20 MG/ML IJ SOLN: 10.0000 mg | INTRAMUSCULAR | Status: DC | PRN

## 2024-01-30 MED ORDER — CIPROFLOXACIN-DEXAMETHASONE 0.3-0.1 % OT SUSP
OTIC | Status: AC
  Filled 2024-01-30: qty 7.5

## 2024-01-30 MED ORDER — FENTANYL CITRATE (PF) 250 MCG/5ML IJ SOLN
INTRAMUSCULAR | Status: AC
  Filled 2024-01-30: qty 5

## 2024-01-30 MED ORDER — ROCURONIUM BROMIDE 10 MG/ML (PF) SYRINGE
PREFILLED_SYRINGE | INTRAVENOUS | Status: AC
  Filled 2024-01-30: qty 10

## 2024-01-30 MED ORDER — BACITRACIN ZINC 500 UNIT/GM EX OINT
TOPICAL_OINTMENT | CUTANEOUS | Status: AC
  Filled 2024-01-30: qty 28.35

## 2024-01-30 MED ORDER — SODIUM CHLORIDE 0.9% IV SOLUTION: Freq: Once | INTRAVENOUS | Status: DC

## 2024-01-30 MED ORDER — ORAL CARE MOUTH RINSE
15.0000 mL | OROMUCOSAL | Status: DC
  Administered 2024-01-30 – 2024-01-31 (×18): 15 mL via OROMUCOSAL

## 2024-01-30 MED ORDER — IOHEXOL 350 MG/ML SOLN
75.0000 mL | Freq: Once | INTRAVENOUS | Status: AC | PRN
  Administered 2024-01-30 (×2): 75 mL via INTRAVENOUS

## 2024-01-30 MED ORDER — FENTANYL CITRATE PF 50 MCG/ML IJ SOSY: 50.0000 ug | PREFILLED_SYRINGE | INTRAMUSCULAR | Status: DC | PRN

## 2024-01-30 MED ORDER — PHENYLEPHRINE HCL-NACL 20-0.9 MG/250ML-% IV SOLN
INTRAVENOUS | Status: DC | PRN
  Administered 2024-01-30 (×2): 20 ug/min via INTRAVENOUS

## 2024-01-30 MED ORDER — METOPROLOL TARTRATE 5 MG/5ML IV SOLN: 5.0000 mg | Freq: Four times a day (QID) | INTRAVENOUS | Status: DC | PRN

## 2024-01-30 MED ORDER — PANTOPRAZOLE SODIUM 40 MG IV SOLR
40.0000 mg | Freq: Every day | INTRAVENOUS | Status: DC
  Administered 2024-01-30 – 2024-02-11 (×20): 40 mg via INTRAVENOUS
  Filled 2024-01-30 (×12): qty 10

## 2024-01-30 MED ORDER — ONDANSETRON HCL 4 MG/2ML IJ SOLN
4.0000 mg | Freq: Four times a day (QID) | INTRAMUSCULAR | Status: DC | PRN
  Administered 2024-01-31 – 2024-02-05 (×10): 4 mg via INTRAVENOUS
  Filled 2024-01-30 (×5): qty 2

## 2024-01-30 MED ORDER — METHOCARBAMOL 1000 MG/10ML IJ SOLN
500.0000 mg | Freq: Three times a day (TID) | INTRAMUSCULAR | Status: DC
  Administered 2024-01-30 – 2024-02-01 (×10): 500 mg via INTRAVENOUS
  Filled 2024-01-30 (×5): qty 10

## 2024-01-30 MED ORDER — 0.9 % SODIUM CHLORIDE (POUR BTL) OPTIME
TOPICAL | Status: DC | PRN
  Administered 2024-01-30 (×2): 1000 mL

## 2024-01-30 MED ORDER — PROPOFOL 1000 MG/100ML IV EMUL
0.0000 ug/kg/min | INTRAVENOUS | Status: DC
  Administered 2024-01-30 (×2): 20 ug/kg/min via INTRAVENOUS
  Filled 2024-01-30: qty 100

## 2024-01-30 MED ORDER — DOCUSATE SODIUM 50 MG/5ML PO LIQD
100.0000 mg | Freq: Two times a day (BID) | ORAL | Status: DC
  Administered 2024-01-30 – 2024-01-31 (×4): 100 mg
  Filled 2024-01-30 (×2): qty 10

## 2024-01-30 MED ORDER — METHOCARBAMOL 500 MG PO TABS: 500.0000 mg | ORAL_TABLET | Freq: Three times a day (TID) | ORAL | Status: DC

## 2024-01-30 MED ORDER — DEXAMETHASONE SODIUM PHOSPHATE 10 MG/ML IJ SOLN
INTRAMUSCULAR | Status: DC | PRN
  Administered 2024-01-30 (×2): 8 mg via INTRAVENOUS

## 2024-01-30 MED ORDER — PROPOFOL 1000 MG/100ML IV EMUL
0.0000 ug/kg/min | INTRAVENOUS | Status: DC
  Administered 2024-01-30: 40 ug/kg/min via INTRAVENOUS
  Administered 2024-01-30 (×2): 50 ug/kg/min via INTRAVENOUS
  Administered 2024-01-30: 40 ug/kg/min via INTRAVENOUS
  Administered 2024-01-31 (×2): 10 ug/kg/min via INTRAVENOUS
  Filled 2024-01-30: qty 100

## 2024-01-30 MED ORDER — LIDOCAINE 2% (20 MG/ML) 5 ML SYRINGE
INTRAMUSCULAR | Status: AC
  Filled 2024-01-30: qty 5

## 2024-01-30 MED ORDER — FENTANYL CITRATE PF 50 MCG/ML IJ SOSY: 25.0000 ug | PREFILLED_SYRINGE | Freq: Once | INTRAMUSCULAR | Status: DC

## 2024-01-30 MED ORDER — ORAL CARE MOUTH RINSE: 15.0000 mL | OROMUCOSAL | Status: DC | PRN

## 2024-01-30 MED ORDER — ROCURONIUM BROMIDE 10 MG/ML (PF) SYRINGE
PREFILLED_SYRINGE | INTRAVENOUS | Status: DC | PRN
  Administered 2024-01-30: 30 mg via INTRAVENOUS
  Administered 2024-01-30: 20 mg via INTRAVENOUS
  Administered 2024-01-30 (×2): 50 mg via INTRAVENOUS
  Administered 2024-01-30: 20 mg via INTRAVENOUS
  Administered 2024-01-30: 30 mg via INTRAVENOUS
  Administered 2024-01-30 (×2): 50 mg via INTRAVENOUS

## 2024-01-30 MED ORDER — SODIUM CHLORIDE 0.9 % IV SOLN
3.0000 g | Freq: Three times a day (TID) | INTRAVENOUS | Status: AC
  Administered 2024-01-30 – 2024-02-09 (×60): 3 g via INTRAVENOUS
  Filled 2024-01-30 (×29): qty 8

## 2024-01-30 MED ORDER — TETANUS-DIPHTH-ACELL PERTUSSIS 5-2.5-18.5 LF-MCG/0.5 IM SUSY
0.5000 mL | PREFILLED_SYRINGE | Freq: Once | INTRAMUSCULAR | Status: AC
  Administered 2024-01-30 (×2): 0.5 mL via INTRAMUSCULAR

## 2024-01-30 MED ORDER — CIPROFLOXACIN-DEXAMETHASONE 0.3-0.1 % OT SUSP
OTIC | Status: DC | PRN
  Administered 2024-01-30 (×2): 30 [drp] via OTIC

## 2024-01-30 MED ORDER — ETOMIDATE 2 MG/ML IV SOLN
INTRAVENOUS | Status: AC
  Filled 2024-01-30: qty 10

## 2024-01-30 MED ORDER — PHENYLEPHRINE 80 MCG/ML (10ML) SYRINGE FOR IV PUSH (FOR BLOOD PRESSURE SUPPORT)
PREFILLED_SYRINGE | INTRAVENOUS | Status: DC | PRN
  Administered 2024-01-30: 160 ug via INTRAVENOUS
  Administered 2024-01-30: 80 ug via INTRAVENOUS
  Administered 2024-01-30: 240 ug via INTRAVENOUS
  Administered 2024-01-30: 160 ug via INTRAVENOUS
  Administered 2024-01-30: 80 ug via INTRAVENOUS
  Administered 2024-01-30: 240 ug via INTRAVENOUS

## 2024-01-30 MED ORDER — ETOMIDATE 2 MG/ML IV SOLN
INTRAVENOUS | Status: AC | PRN
  Administered 2024-01-30 (×2): 20 mg via INTRAVENOUS

## 2024-01-30 MED ORDER — LACTATED RINGERS IV SOLN: INTRAVENOUS | Status: DC | PRN

## 2024-01-30 MED ORDER — SUCCINYLCHOLINE CHLORIDE 200 MG/10ML IV SOSY
PREFILLED_SYRINGE | INTRAVENOUS | Status: AC
  Filled 2024-01-30: qty 10

## 2024-01-30 MED ORDER — SODIUM CHLORIDE 0.9 % IV SOLN
3.0000 g | Freq: Once | INTRAVENOUS | Status: AC
  Administered 2024-01-30 (×2): 3 g via INTRAVENOUS

## 2024-01-30 MED ORDER — FENTANYL 2500MCG IN NS 250ML (10MCG/ML) PREMIX INFUSION
0.0000 ug/h | INTRAVENOUS | Status: DC
  Administered 2024-01-30 – 2024-01-31 (×4): 75 ug/h via INTRAVENOUS
  Filled 2024-01-30: qty 250

## 2024-01-30 MED ORDER — LACTATED RINGERS IV BOLUS
1000.0000 mL | Freq: Once | INTRAVENOUS | Status: AC
  Administered 2024-01-30 (×2): 1000 mL via INTRAVENOUS

## 2024-01-30 MED ORDER — DEXAMETHASONE SODIUM PHOSPHATE 10 MG/ML IJ SOLN
INTRAMUSCULAR | Status: AC
  Filled 2024-01-30: qty 1

## 2024-01-30 MED ORDER — SUCCINYLCHOLINE CHLORIDE 20 MG/ML IJ SOLN
INTRAMUSCULAR | Status: AC | PRN
  Administered 2024-01-30 (×2): 120 mg via INTRAVENOUS

## 2024-01-30 MED ORDER — SODIUM CHLORIDE 0.9 % IV SOLN: INTRAVENOUS | Status: AC

## 2024-01-30 MED ORDER — BACITRACIN ZINC 500 UNIT/GM EX OINT
TOPICAL_OINTMENT | CUTANEOUS | Status: DC | PRN
  Administered 2024-01-30 (×2): 1 via TOPICAL

## 2024-01-30 MED ORDER — FENTANYL CITRATE (PF) 250 MCG/5ML IJ SOLN
INTRAMUSCULAR | Status: DC | PRN
  Administered 2024-01-30 (×10): 50 ug via INTRAVENOUS

## 2024-01-30 MED ORDER — LIDOCAINE-EPINEPHRINE 1 %-1:100000 IJ SOLN
INTRAMUSCULAR | Status: DC | PRN
  Administered 2024-01-30 (×2): 20 mL

## 2024-01-30 MED ORDER — FENTANYL BOLUS VIA INFUSION
25.0000 ug | INTRAVENOUS | Status: DC | PRN
  Administered 2024-01-31 (×2): 25 ug via INTRAVENOUS

## 2024-01-30 MED ORDER — PROPOFOL 10 MG/ML IV BOLUS
INTRAVENOUS | Status: AC
  Filled 2024-01-30: qty 20

## 2024-01-30 SURGICAL SUPPLY — 60 items
BIT DRILL 1.5X50 7MMSTP W/NTCH (BIT) IMPLANT
BIT DRILL 1.6X115MM (BIT) IMPLANT
BIT DRILL 1.6X50 (BIT) IMPLANT
BLADE SURG 15 STRL LF DISP TIS (BLADE) IMPLANT
BNDG COHESIVE 4X5 TAN STRL LF (GAUZE/BANDAGES/DRESSINGS) IMPLANT
BNDG GAUZE DERMACEA FLUFF 4 (GAUZE/BANDAGES/DRESSINGS) IMPLANT
CANISTER SUCTION 3000ML PPV (SUCTIONS) ×1 IMPLANT
CLIP TI MEDIUM 24 (CLIP) IMPLANT
CLIP TI WIDE RED SMALL 24 (CLIP) IMPLANT
CORD BIPOLAR FORCEPS 12FT (ELECTRODE) IMPLANT
COVER SURGICAL LIGHT HANDLE (MISCELLANEOUS) ×1 IMPLANT
DRAIN PENROSE 0.5X18 (DRAIN) IMPLANT
DRESSING NASAL KENNEDY 3.5X.9 (MISCELLANEOUS) IMPLANT
DRILL TW 1.1X50 7MM STOP W/NT (MISCELLANEOUS) IMPLANT
DRSG TELFA 3X8 NADH STRL (GAUZE/BANDAGES/DRESSINGS) IMPLANT
ELECT COATED BLADE 2.86 ST (ELECTRODE) ×1 IMPLANT
ELECTRODE REM PT RTRN 9FT ADLT (ELECTROSURGICAL) ×1 IMPLANT
GAUZE SPONGE 4X4 12PLY STRL (GAUZE/BANDAGES/DRESSINGS) IMPLANT
GLOVE BIO SURGEON STRL SZ7.5 (GLOVE) ×1 IMPLANT
GLOVE BIOGEL PI IND STRL 8 (GLOVE) ×1 IMPLANT
GOWN STRL REUS W/ TWL LRG LVL3 (GOWN DISPOSABLE) ×2 IMPLANT
GOWN STRL REUS W/ TWL XL LVL3 (GOWN DISPOSABLE) ×1 IMPLANT
HEMOSTAT HEMOBLAST BELLOWS (HEMOSTASIS) IMPLANT
HOOK RETRACT STAY BLUNT 12 (MISCELLANEOUS) IMPLANT
IV CATH 14GX2 1/4 (CATHETERS) ×1 IMPLANT
KIT BASIN OR (CUSTOM PROCEDURE TRAY) ×1 IMPLANT
KIT TURNOVER KIT B (KITS) ×1 IMPLANT
MARKER SKIN DUAL TIP RULER LAB (MISCELLANEOUS) ×1 IMPLANT
NDL PRECISIONGLIDE 27X1.5 (NEEDLE) IMPLANT
NEEDLE PRECISIONGLIDE 27X1.5 (NEEDLE) ×1 IMPLANT
NS IRRIG 1000ML POUR BTL (IV SOLUTION) ×1 IMPLANT
PAD ARMBOARD POSITIONER FOAM (MISCELLANEOUS) ×2 IMPLANT
PENCIL SMOKE EVACUATOR (MISCELLANEOUS) ×1 IMPLANT
PLATE LOCK 12H STR 2.0 BLUE (Plate) IMPLANT
PLATE LOCK 4H W/GAP 1.0 GRD 3 (Plate) IMPLANT
PLATE LOCK STRT 2 X 7 MATRIX (Plate) IMPLANT
PLATE LOCK STRT 4H 1/SCREW (Plate) IMPLANT
PLATE RGD 1.5/0.6 4 HOLE (Plate) IMPLANT
SCREW LOCKING X-DR 2.0X10 (Screw) IMPLANT
SCREW LOCKING X-DR 2.0X12 (Screw) IMPLANT
SCREW LOCKING X-DR 2.0X14 (Screw) IMPLANT
SCREW LOCKING X-DR 2.0X6 (Screw) IMPLANT
SCREW LOCKING X-DR 2.3X8 (Screw) IMPLANT
SCREW NON LOCK HT X-DR 1.5X5 (Screw) IMPLANT
SCREW NON LOCK X-DR 2.0X5 (Screw) IMPLANT
SCREW NON LOCK X-DR 2.0X6 (Screw) IMPLANT
SCREW NON LOCK X-DR 2.3X8 (Screw) IMPLANT
SET WALTER ACTIVATION W/DRAPE (SET/KITS/TRAYS/PACK) IMPLANT
SPONGE INTESTINAL PEANUT (DISPOSABLE) IMPLANT
SPONGE T-LAP 18X18 ~~LOC~~+RFID (SPONGE) IMPLANT
STAPLER SKIN PROX 35W (STAPLE) ×1 IMPLANT
SUT CHROMIC 5 0 RB 1 27 (SUTURE) IMPLANT
SUT ETHILON 5 0 PS 2 18 (SUTURE) IMPLANT
SUT SILK 2 0 SH (SUTURE) IMPLANT
SUT SILK 3 0 REEL (SUTURE) IMPLANT
SUT VIC AB 3-0 SH 8-18 (SUTURE) IMPLANT
SYR CONTROL 10ML LL (SYRINGE) ×1 IMPLANT
TOWEL GREEN STERILE (TOWEL DISPOSABLE) ×1 IMPLANT
TRAY ENT MC OR (CUSTOM PROCEDURE TRAY) ×1 IMPLANT
WATER STERILE IRR 1000ML POUR (IV SOLUTION) ×1 IMPLANT
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
## 2024-01-30 NOTE — Anesthesia Preprocedure Evaluation (Signed)
 Anesthesia Evaluation    Reviewed: H&P   Airway Mallampati: Intubated       Dental  (+) Teeth Intact   Pulmonary       + intubated    Cardiovascular  Rhythm:Regular Rate:Tachycardia     Neuro/Psych  negative psych ROS   GI/Hepatic   Endo/Other    Renal/GU      Musculoskeletal   Abdominal   Peds  Hematology   Anesthesia Other Findings   Reproductive/Obstetrics                              Anesthesia Physical Anesthesia Plan  ASA: 4 and emergent  Anesthesia Plan: General   Post-op Pain Management:    Induction: Intravenous  PONV Risk Score and Plan: 2 and Treatment may vary due to age or medical condition  Airway Management Planned: Oral ETT  Additional Equipment:   Intra-op Plan:   Post-operative Plan: Post-operative intubation/ventilation  Informed Consent:   Plan Discussed with:   Anesthesia Plan Comments:         Anesthesia Quick Evaluation
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
## 2024-01-30 NOTE — ED Notes (Signed)
Pt log-rolled at this time

## 2024-01-30 NOTE — Transfer of Care (Signed)
 Immediate Anesthesia Transfer of Care Note  Patient: [name] [name]  Procedure(s) Performed: OPEN REDUCTION INTERNAL FIXATION (ORIF) MANDIBULAR FRACTURE (Left: Face) REPAIR, LACERATION, FACE (Left: Face)  Patient Location: ICU  Anesthesia Type:General  Level of Consciousness: Patient remains intubated per anesthesia plan  Airway & Oxygen Therapy: Patient placed on Ventilator (see vital sign flow sheet for setting)  Post-op Assessment: Report given to RN and Post -op Vital signs reviewed and stable  Post vital signs: Reviewed and stable  Last Vitals:  Vitals Value Taken Time  BP    Temp    Pulse    Resp    SpO2      Last Pain:  Vitals:   01/30/24 1332  TempSrc:   PainSc: 0-No pain         Complications: No notable events documented.
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
## 2024-01-31 ENCOUNTER — Encounter (HOSPITAL_COMMUNITY): Payer: Self-pay | Admitting: Otolaryngology

## 2024-01-31 LAB — CBC
HCT: 29 % — ABNORMAL LOW (ref 39.0–52.0)
Hemoglobin: 8.9 g/dL — ABNORMAL LOW (ref 13.0–17.0)
MCH: 29.8 pg (ref 26.0–34.0)
MCHC: 30.7 g/dL (ref 30.0–36.0)
MCV: 97 fL (ref 80.0–100.0)
Platelets: 146 K/uL — ABNORMAL LOW (ref 150–400)
RBC: 2.99 MIL/uL — ABNORMAL LOW (ref 4.22–5.81)
RDW: 14 % (ref 11.5–15.5)
WBC: 12.7 K/uL — ABNORMAL HIGH (ref 4.0–10.5)
nRBC: 0 % (ref 0.0–0.2)

## 2024-01-31 LAB — POCT I-STAT EG7
Acid-base deficit: 1 mmol/L (ref 0.0–2.0)
Acid-base deficit: 1 mmol/L (ref 0.0–2.0)
Bicarbonate: 25.8 mmol/L (ref 20.0–28.0)
Bicarbonate: 26.7 mmol/L (ref 20.0–28.0)
Calcium, Ion: 0.8 mmol/L — CL (ref 1.15–1.40)
Calcium, Ion: 1.32 mmol/L (ref 1.15–1.40)
HCT: 37 % — ABNORMAL LOW (ref 39.0–52.0)
HCT: 27 % — ABNORMAL LOW (ref 39.0–52.0)
Hemoglobin: 12.6 g/dL — ABNORMAL LOW (ref 13.0–17.0)
Hemoglobin: 9.2 g/dL — ABNORMAL LOW (ref 13.0–17.0)
O2 Saturation: 65 %
O2 Saturation: 37 %
Patient temperature: 35
Patient temperature: 36
Potassium: 6.3 mmol/L (ref 3.5–5.1)
Potassium: 4.2 mmol/L (ref 3.5–5.1)
Sodium: 177 mmol/L (ref 135–145)
Sodium: 142 mmol/L (ref 135–145)
TCO2: 27 mmol/L (ref 22–32)
TCO2: 29 mmol/L (ref 22–32)
pCO2, Ven: 46.7 mmHg (ref 44–60)
pCO2, Ven: 58.2 mmHg (ref 44–60)
pH, Ven: 7.34 (ref 7.25–7.43)
pH, Ven: 7.264 (ref 7.25–7.43)
pO2, Ven: 32 mmHg (ref 32–45)
pO2, Ven: 24 mmHg — CL (ref 32–45)

## 2024-01-31 LAB — BASIC METABOLIC PANEL WITH GFR
Anion gap: 13 (ref 5–15)
BUN: 17 mg/dL (ref 8–23)
CO2: 19 mmol/L — ABNORMAL LOW (ref 22–32)
Calcium: 9.3 mg/dL (ref 8.9–10.3)
Chloride: 110 mmol/L (ref 98–111)
Creatinine, Ser: 1.16 mg/dL (ref 0.61–1.24)
GFR, Estimated: >60 mL/min (ref >60)
Glucose, Bld: 133 mg/dL — ABNORMAL HIGH (ref 70–99)
Potassium: 4.6 mmol/L (ref 3.5–5.1)
Sodium: 142 mmol/L (ref 135–145)

## 2024-01-31 LAB — MRSA NEXT GEN BY PCR, NASAL: MRSA by PCR Next Gen: NOT DETECTED

## 2024-01-31 LAB — TRIGLYCERIDES: Triglycerides: 86 mg/dL (ref <150)

## 2024-01-31 MED ORDER — ORAL CARE MOUTH RINSE
15.0000 mL | OROMUCOSAL | Status: DC | PRN
Start: 2024-01-31 — End: 2024-02-23

## 2024-01-31 MED ORDER — SODIUM CHLORIDE 0.9 % IV SOLN: INTRAVENOUS | Status: AC

## 2024-01-31 MED ORDER — ORAL CARE MOUTH RINSE
15.0000 mL | OROMUCOSAL | Status: DC
  Administered 2024-01-31 – 2024-02-23 (×178): 15 mL via OROMUCOSAL

## 2024-01-31 MED ORDER — CARMEX CLASSIC LIP BALM EX OINT
TOPICAL_OINTMENT | CUTANEOUS | Status: DC | PRN
  Administered 2024-02-05 – 2024-02-11 (×6): 1 via TOPICAL
  Filled 2024-01-31 (×2): qty 10

## 2024-01-31 MED ORDER — DIPHENHYDRAMINE HCL 50 MG/ML IJ SOLN
12.5000 mg | Freq: Every evening | INTRAMUSCULAR | Status: DC | PRN
  Administered 2024-02-03 – 2024-02-06 (×8): 12.5 mg via INTRAVENOUS
  Filled 2024-01-31 (×4): qty 1

## 2024-01-31 MED ORDER — LACTATED RINGERS IV BOLUS
1000.0000 mL | Freq: Once | INTRAVENOUS | Status: AC
  Administered 2024-01-31 (×2): 1000 mL via INTRAVENOUS

## 2024-01-31 MED ORDER — ACETAMINOPHEN 160 MG/5ML PO SOLN
650.0000 mg | Freq: Four times a day (QID) | ORAL | Status: DC | PRN
  Administered 2024-01-31 (×2): 350 mg via ORAL
  Filled 2024-01-31: qty 20.3

## 2024-01-31 MED ORDER — SODIUM CHLORIDE 0.9 % IV BOLUS
500.0000 mL | Freq: Once | INTRAVENOUS | Status: AC
  Administered 2024-01-31 (×2): 500 mL via INTRAVENOUS

## 2024-01-31 NOTE — Progress Notes (Signed)
 Chaplain responded to spiritual care consult. I met with Pt [name] [name] who expressed his dispirited state of mind. He describes his situation as creating more problems than he set out to solve. I invited him to see himself as experiencing these problems rather than identifying himself as the problem. At this time, he unfortunately has little support from friends/community. He has a caring relationship with his wife, however he lives alone and only sees her at a nursing facility. His two sons live out of town, and although he has 7 grandchildren, he doesn't have a close relationship with any of them.  [name] [name] expressed gratitude for chaplain presence and welcomes me back to talk with him in the future. I will continue to follow up, and we remain available as needs arise.

## 2024-01-31 NOTE — Procedures (Signed)
 Extubation Procedure Note  Patient Details:   Name: LENTON GENDREAU DOB: 1934-03-12 MRN: [id number]   Airway Documentation:    Vent end date: 01/31/24 Vent end time: 1414   Evaluation  O2 sats: stable throughout Complications: No apparent complications Patient did tolerate procedure well. Bilateral Breath Sounds: Clear, Diminished   Yes  Patient extubated per order to 2Lnc with no apparent complications. Positive cuff leak was noted prior to extubation. Patient is alert, has strong cough, and is able to speak. Vitals are stable.   Ricard JULIANNA Eagles 01/31/2024, 2:15 PM
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
## 2024-01-31 NOTE — Anesthesia Postprocedure Evaluation (Signed)
 Anesthesia Post Note  Patient: [name] [name]  Procedure(s) Performed: OPEN REDUCTION INTERNAL FIXATION (ORIF) MANDIBULAR FRACTURE (Left: Face) REPAIR, LACERATION, FACE (Left: Face)     Patient location during evaluation: SICU Anesthesia Type: General Level of consciousness: sedated Pain management: pain level controlled Vital Signs Assessment: post-procedure vital signs reviewed and stable Respiratory status: patient remains intubated per anesthesia plan Cardiovascular status: stable Postop Assessment: no apparent nausea or vomiting Anesthetic complications: no   No notable events documented.  Last Vitals:  Vitals:   01/31/24 0600 01/31/24 0630  BP: 115/61 116/67  Pulse:  72  Resp: (!) 24 (!) 25  Temp: 37.2 C 37.2 C  SpO2:  100%    Last Pain:  Vitals:   01/31/24 0400  TempSrc: Bladder  PainSc:                  Cordella P Florentina Marquart
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
## 2024-02-01 ENCOUNTER — Encounter (HOSPITAL_COMMUNITY): Payer: Self-pay

## 2024-02-01 LAB — MAGNESIUM: Magnesium: 1.9 mg/dL (ref 1.7–2.4)

## 2024-02-01 LAB — CBC
HCT: 21.3 % — ABNORMAL LOW (ref 39.0–52.0)
HCT: 23.4 % — ABNORMAL LOW (ref 39.0–52.0)
Hemoglobin: 6.8 g/dL — CL (ref 13.0–17.0)
Hemoglobin: 7.6 g/dL — ABNORMAL LOW (ref 13.0–17.0)
MCH: 29.8 pg (ref 26.0–34.0)
MCH: 30 pg (ref 26.0–34.0)
MCHC: 31.9 g/dL (ref 30.0–36.0)
MCHC: 32.5 g/dL (ref 30.0–36.0)
MCV: 93.4 fL (ref 80.0–100.0)
MCV: 92.5 fL (ref 80.0–100.0)
Platelets: 127 K/uL — ABNORMAL LOW (ref 150–400)
Platelets: 117 K/uL — ABNORMAL LOW (ref 150–400)
RBC: 2.28 MIL/uL — ABNORMAL LOW (ref 4.22–5.81)
RBC: 2.53 MIL/uL — ABNORMAL LOW (ref 4.22–5.81)
RDW: 14.7 % (ref 11.5–15.5)
RDW: 15.6 % — ABNORMAL HIGH (ref 11.5–15.5)
WBC: 12.5 K/uL — ABNORMAL HIGH (ref 4.0–10.5)
WBC: 11.8 K/uL — ABNORMAL HIGH (ref 4.0–10.5)
nRBC: 0 % (ref 0.0–0.2)
nRBC: 0 % (ref 0.0–0.2)

## 2024-02-01 LAB — BASIC METABOLIC PANEL WITH GFR
Anion gap: 10 (ref 5–15)
BUN: 16 mg/dL (ref 8–23)
CO2: 21 mmol/L — ABNORMAL LOW (ref 22–32)
Calcium: 9.5 mg/dL (ref 8.9–10.3)
Chloride: 110 mmol/L (ref 98–111)
Creatinine, Ser: 1.03 mg/dL (ref 0.61–1.24)
GFR, Estimated: >60 mL/min (ref >60)
Glucose, Bld: 115 mg/dL — ABNORMAL HIGH (ref 70–99)
Potassium: 4.1 mmol/L (ref 3.5–5.1)
Sodium: 141 mmol/L (ref 135–145)

## 2024-02-01 LAB — TSH: TSH: 0.463 u[IU]/mL (ref 0.350–4.500)

## 2024-02-01 LAB — GLUCOSE, CAPILLARY
Glucose-Capillary: 117 mg/dL — ABNORMAL HIGH (ref 70–99)
Glucose-Capillary: 118 mg/dL — ABNORMAL HIGH (ref 70–99)
Glucose-Capillary: 92 mg/dL (ref 70–99)

## 2024-02-01 LAB — PHOSPHORUS: Phosphorus: 1.8 mg/dL — ABNORMAL LOW (ref 2.5–4.6)

## 2024-02-01 LAB — VITAMIN B12: Vitamin B-12: 270 pg/mL (ref 180–914)

## 2024-02-01 LAB — FOLATE: Folate: 6.5 ng/mL (ref >5.9)

## 2024-02-01 LAB — PREPARE RBC (CROSSMATCH)

## 2024-02-01 MED ORDER — METHOCARBAMOL 500 MG PO TABS
500.0000 mg | ORAL_TABLET | Freq: Three times a day (TID) | ORAL | Status: AC
  Administered 2024-02-02 (×2): 500 mg via ORAL
  Filled 2024-02-01 (×2): qty 1

## 2024-02-01 MED ORDER — SODIUM CHLORIDE 0.9% IV SOLUTION: Freq: Once | INTRAVENOUS | Status: DC

## 2024-02-01 MED ORDER — ACETAMINOPHEN 160 MG/5ML PO SOLN
650.0000 mg | Freq: Four times a day (QID) | ORAL | Status: DC
  Administered 2024-02-01 – 2024-02-11 (×76): 650 mg
  Filled 2024-02-01 (×38): qty 20.3

## 2024-02-01 MED ORDER — THIAMINE MONONITRATE 100 MG PO TABS
100.0000 mg | ORAL_TABLET | Freq: Every day | ORAL | Status: AC
  Administered 2024-02-01 – 2024-02-07 (×14): 100 mg
  Filled 2024-02-01 (×7): qty 1

## 2024-02-01 MED ORDER — OXYCODONE HCL 5 MG PO TABS
5.0000 mg | ORAL_TABLET | ORAL | Status: DC | PRN
  Administered 2024-02-02 (×2): 5 mg
  Administered 2024-02-03 – 2024-02-04 (×6): 10 mg
  Administered 2024-02-05: 5 mg
  Administered 2024-02-05 (×2): 10 mg
  Administered 2024-02-05: 5 mg
  Administered 2024-02-06 – 2024-02-13 (×34): 10 mg
  Filled 2024-02-01: qty 2
  Filled 2024-02-01 (×2): qty 1
  Filled 2024-02-01 (×3): qty 2
  Filled 2024-02-01: qty 1
  Filled 2024-02-01 (×5): qty 2
  Filled 2024-02-01: qty 1
  Filled 2024-02-01 (×12): qty 2

## 2024-02-01 MED ORDER — METHOCARBAMOL 1000 MG/10ML IJ SOLN
500.0000 mg | Freq: Three times a day (TID) | INTRAMUSCULAR | Status: AC
  Administered 2024-02-01 (×4): 500 mg via INTRAVENOUS
  Filled 2024-02-01 (×2): qty 10

## 2024-02-01 MED ORDER — ADULT MULTIVITAMIN W/MINERALS CH
1.0000 | ORAL_TABLET | Freq: Every day | ORAL | Status: DC
  Administered 2024-02-01 – 2024-02-14 (×28): 1
  Filled 2024-02-01 (×14): qty 1

## 2024-02-01 MED ORDER — MORPHINE SULFATE (PF) 2 MG/ML IV SOLN
2.0000 mg | INTRAVENOUS | Status: DC | PRN
  Administered 2024-02-05 – 2024-02-18 (×10): 2 mg via INTRAVENOUS
  Filled 2024-02-01 (×5): qty 1

## 2024-02-01 MED ORDER — PROSOURCE TF20 ENFIT COMPATIBL EN LIQD
60.0000 mL | Freq: Two times a day (BID) | ENTERAL | Status: DC
  Administered 2024-02-01 – 2024-02-13 (×48): 60 mL
  Filled 2024-02-01 (×24): qty 60

## 2024-02-01 MED ORDER — OSMOLITE 1.5 CAL PO LIQD
1000.0000 mL | ORAL | Status: DC
  Administered 2024-02-01 – 2024-02-09 (×16): 1000 mL
  Filled 2024-02-01 (×3): qty 1000

## 2024-02-01 NOTE — Progress Notes (Signed)
 Transition of Care [facility]) - CAGE-AID Screening   Patient Details  Name: LAWARENCE MEEK MRN: [id number] Date of Birth: 13-Jun-1933  Transition of Care [facility]) CM/SW Contact:    Sallyanne MALVA Mettle, RN Phone Number: 02/01/2024, 9:25 PM    CAGE-AID Screening: Substance Abuse Screening unable to be completed due to: : Patient unable to participate (intubated/sedated)  Have You Ever Felt You Ought to Cut Down on Your Drinking or Drug Use?: No Have People Annoyed You By Critizing Your Drinking Or Drug Use?: No Have You Felt Bad Or Guilty About Your Drinking Or Drug Use?: No Have You Ever Had a Drink or Used Drugs First Thing In The Morning to Steady Your Nerves or to Get Rid of a Hangover?: No CAGE-AID Score: 0  Substance Abuse Education Offered: No (denies drug/alcohol use, no resources indicated)
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
## 2024-02-01 NOTE — Progress Notes (Signed)
 Date and time results received: 02/01/24 0617  Test: Hgb Critical Value: 6.8  Name of Provider Notified: Kinsinger, MD   Orders Received? Or Actions Taken?:  Transfuse 1 PRBC
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
## 2024-02-01 NOTE — Plan of Care (Signed)

## 2024-02-01 NOTE — Progress Notes (Signed)
 Pt started on feeds thru coretrak at 20 ML hr w/ no flush at this time.  [name] [name] 02/01/24 7:08 PM

## 2024-02-01 NOTE — Progress Notes (Signed)
 PT Cancellation Note  Patient Details Name: [name] [name] MRN: [id number] DOB: [birth year]   Cancelled Treatment:    Reason Eval/Treat Not Completed: Patient declined, no reason specified - pt and family checked on this am around 1020, pt and family requesting PT hold until SLP consult. Per SLP, pt is NPO and awaiting cortrak at this time. PT to check back tomorrow.   Mashawn Brazil S, PT DPT Acute Rehabilitation Services Secure Chat Preferred  Office [phone]    [name] 02/01/2024, 12:44 PM

## 2024-02-01 NOTE — Plan of Care (Signed)
  Problem: Education: Goal: Knowledge of General Education information will improve Description: Including pain rating scale, medication(s)/side effects and non-pharmacologic comfort measures Outcome: Progressing   Problem: Clinical Measurements: Goal: Ability to maintain clinical measurements within normal limits will improve Outcome: Progressing Goal: Respiratory complications will improve Outcome: Progressing Goal: Cardiovascular complication will be avoided Outcome: Progressing   Problem: Coping: Goal: Level of anxiety will decrease Outcome: Progressing   Problem: Pain Managment: Goal: General experience of comfort will improve and/or be controlled Outcome: Progressing   Problem: Safety: Goal: Ability to remain free from injury will improve Outcome: Progressing   Problem: Skin Integrity: Goal: Risk for impaired skin integrity will decrease Outcome: Progressing

## 2024-02-01 NOTE — Procedures (Signed)
 Cortrak  Person Inserting Tube:  Valia Wingard, Olivia SAUNDERS, RD Tube Type:  Cortrak - 43 inches Tube Size:  10 Tube Location:  Left nare Secured by: Bridle Initial Placement:  Gastric Technique Used to Measure Tube Placement:  Marking at nare/corner of mouth Cortrak Secured At:  70 cm   Cortrak Tube Team Note:  Consult received to place a Cortrak feeding tube.   No x-ray is required. RN may begin using tube.   If the tube becomes dislodged please keep the tube and contact the Cortrak team at [email or web address] for replacement.  If after hours and replacement cannot be delayed, place a NG tube and confirm placement with an abdominal x-ray.    Olivia Kenning, RD Registered Dietitian  See Amion for more information

## 2024-02-01 NOTE — Progress Notes (Signed)
 OT Cancellation Note  Patient Details Name: [name] [name] MRN: [id number] DOB: [birth year]   Cancelled Treatment:    Reason Eval/Treat Not Completed: Patient declined, no reason specified.  Family wanting patient have ST eval first, and asking OT to wait.  OT will honor families wishes and follow up next date.    Cordell Coke D Evert Wenrich 02/01/2024, 1:33 PM 02/01/2024  RP, OTR/L  Acute Rehabilitation Services  Office:  [phone]

## 2024-02-01 NOTE — TOC Progression Note (Addendum)
 Transition of Care [facility]) - Progression Note    Patient Details  Name: [name] [name] MRN: [id number] Date of Birth: June 29, 1933  [facility] CM/SW Contact  Kemauri Musa E Anyela Napierkowski, LCSW Phone Number: 02/01/2024, 11:42 AM  Clinical Narrative:    Psych recommends inpatient geri psych. Reached out to [facility] to inquire if patient could go there when medically ready.  1:00- Per MD, patient not medically ready for inpatient psych yet, possibly Tuesday. ICM will follow up and send referrals for geri psych when appropriate.     Barriers to Discharge: Continued Medical Work up               Expected Discharge Plan and Services   Discharge Planning Services: CM Consult   Living arrangements for the past 2 months: Single Family Home                                       Social Drivers of Health (SDOH) Interventions SDOH Screenings   Housing: Low Risk  (01/31/2024)  Transportation Needs: No Transportation Needs (01/31/2024)  Utilities: Not At Risk (01/31/2024)  Social Connections: Moderately Isolated (01/31/2024)    Readmission Risk Interventions     No data to display
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
## 2024-02-02 DIAGNOSIS — F332 Major depressive disorder, recurrent severe without psychotic features: Secondary | ICD-10-CM | POA: Diagnosis not present

## 2024-02-02 LAB — CBC
HCT: 21.4 % — ABNORMAL LOW (ref 39.0–52.0)
HCT: 23 % — ABNORMAL LOW (ref 39.0–52.0)
Hemoglobin: 7 g/dL — ABNORMAL LOW (ref 13.0–17.0)
Hemoglobin: 7.3 g/dL — ABNORMAL LOW (ref 13.0–17.0)
MCH: 30.2 pg (ref 26.0–34.0)
MCH: 29.8 pg (ref 26.0–34.0)
MCHC: 32.7 g/dL (ref 30.0–36.0)
MCHC: 31.7 g/dL (ref 30.0–36.0)
MCV: 92.2 fL (ref 80.0–100.0)
MCV: 93.9 fL (ref 80.0–100.0)
Platelets: 105 K/uL — ABNORMAL LOW (ref 150–400)
Platelets: 110 K/uL — ABNORMAL LOW (ref 150–400)
RBC: 2.32 MIL/uL — ABNORMAL LOW (ref 4.22–5.81)
RBC: 2.45 MIL/uL — ABNORMAL LOW (ref 4.22–5.81)
RDW: 15.7 % — ABNORMAL HIGH (ref 11.5–15.5)
RDW: 15.3 % (ref 11.5–15.5)
WBC: 9 K/uL (ref 4.0–10.5)
WBC: 9.6 K/uL (ref 4.0–10.5)
nRBC: 0 % (ref 0.0–0.2)
nRBC: 0 % (ref 0.0–0.2)

## 2024-02-02 LAB — BASIC METABOLIC PANEL WITH GFR
Anion gap: 5 (ref 5–15)
BUN: 14 mg/dL (ref 8–23)
CO2: 24 mmol/L (ref 22–32)
Calcium: 9.2 mg/dL (ref 8.9–10.3)
Chloride: 113 mmol/L — ABNORMAL HIGH (ref 98–111)
Creatinine, Ser: 1.01 mg/dL (ref 0.61–1.24)
GFR, Estimated: >60 mL/min (ref >60)
Glucose, Bld: 111 mg/dL — ABNORMAL HIGH (ref 70–99)
Potassium: 3.8 mmol/L (ref 3.5–5.1)
Sodium: 142 mmol/L (ref 135–145)

## 2024-02-02 LAB — GLUCOSE, CAPILLARY
Glucose-Capillary: 104 mg/dL — ABNORMAL HIGH (ref 70–99)
Glucose-Capillary: 128 mg/dL — ABNORMAL HIGH (ref 70–99)
Glucose-Capillary: 122 mg/dL — ABNORMAL HIGH (ref 70–99)
Glucose-Capillary: 152 mg/dL — ABNORMAL HIGH (ref 70–99)
Glucose-Capillary: 123 mg/dL — ABNORMAL HIGH (ref 70–99)
Glucose-Capillary: 107 mg/dL — ABNORMAL HIGH (ref 70–99)

## 2024-02-02 LAB — PHOSPHORUS: Phosphorus: 1.7 mg/dL — ABNORMAL LOW (ref 2.5–4.6)

## 2024-02-02 LAB — MAGNESIUM: Magnesium: 2 mg/dL (ref 1.7–2.4)

## 2024-02-02 MED ORDER — POTASSIUM PHOSPHATES 15 MMOLE/5ML IV SOLN
30.0000 mmol | Freq: Once | INTRAVENOUS | Status: AC
  Administered 2024-02-02 (×2): 30 mmol via INTRAVENOUS
  Filled 2024-02-02: qty 10
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
## 2024-02-02 NOTE — Plan of Care (Signed)
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
## 2024-02-03 DIAGNOSIS — F332 Major depressive disorder, recurrent severe without psychotic features: Secondary | ICD-10-CM | POA: Diagnosis not present

## 2024-02-03 LAB — TYPE AND SCREEN
ABO/RH(D): O NEG
Antibody Screen: NEGATIVE
Unit division: 0
Unit division: 0
Unit division: 0

## 2024-02-03 LAB — BASIC METABOLIC PANEL WITH GFR
Anion gap: 8 (ref 5–15)
BUN: 13 mg/dL (ref 8–23)
CO2: 25 mmol/L (ref 22–32)
Calcium: 9.3 mg/dL (ref 8.9–10.3)
Chloride: 108 mmol/L (ref 98–111)
Creatinine, Ser: 0.82 mg/dL (ref 0.61–1.24)
GFR, Estimated: >60 mL/min (ref >60)
Glucose, Bld: 127 mg/dL — ABNORMAL HIGH (ref 70–99)
Potassium: 3.8 mmol/L (ref 3.5–5.1)
Sodium: 141 mmol/L (ref 135–145)

## 2024-02-03 LAB — GLUCOSE, CAPILLARY
Glucose-Capillary: 128 mg/dL — ABNORMAL HIGH (ref 70–99)
Glucose-Capillary: 115 mg/dL — ABNORMAL HIGH (ref 70–99)
Glucose-Capillary: 120 mg/dL — ABNORMAL HIGH (ref 70–99)
Glucose-Capillary: 103 mg/dL — ABNORMAL HIGH (ref 70–99)
Glucose-Capillary: 119 mg/dL — ABNORMAL HIGH (ref 70–99)
Glucose-Capillary: 121 mg/dL — ABNORMAL HIGH (ref 70–99)

## 2024-02-03 LAB — BPAM RBC
Blood Product Expiration Date: 202509062359
Blood Product Expiration Date: 202509092359
Blood Product Expiration Date: 202509192359
ISSUE DATE / TIME: 202508281255
ISSUE DATE / TIME: 202508290705
Unit Type and Rh: 5100
Unit Type and Rh: 9500
Unit Type and Rh: 9500

## 2024-02-03 LAB — CBC
HCT: 22.9 % — ABNORMAL LOW (ref 39.0–52.0)
Hemoglobin: 7.4 g/dL — ABNORMAL LOW (ref 13.0–17.0)
MCH: 30.1 pg (ref 26.0–34.0)
MCHC: 32.3 g/dL (ref 30.0–36.0)
MCV: 93.1 fL (ref 80.0–100.0)
Platelets: 126 K/uL — ABNORMAL LOW (ref 150–400)
RBC: 2.46 MIL/uL — ABNORMAL LOW (ref 4.22–5.81)
RDW: 15.2 % (ref 11.5–15.5)
WBC: 9 K/uL (ref 4.0–10.5)
nRBC: 0.2 % (ref 0.0–0.2)

## 2024-02-03 LAB — MAGNESIUM: Magnesium: 2 mg/dL (ref 1.7–2.4)

## 2024-02-03 LAB — PHOSPHORUS: Phosphorus: 1.9 mg/dL — ABNORMAL LOW (ref 2.5–4.6)

## 2024-02-03 MED ORDER — ENOXAPARIN SODIUM 40 MG/0.4ML IJ SOSY
40.0000 mg | PREFILLED_SYRINGE | INTRAMUSCULAR | Status: DC
  Administered 2024-02-04 (×2): 40 mg via SUBCUTANEOUS
  Filled 2024-02-03: qty 0.4

## 2024-02-03 MED ORDER — CIPROFLOXACIN-DEXAMETHASONE 0.3-0.1 % OT SUSP
4.0000 [drp] | Freq: Two times a day (BID) | OTIC | Status: AC
  Administered 2024-02-03 – 2024-02-18 (×64): 4 [drp] via OTIC
  Filled 2024-02-03 (×2): qty 7.5

## 2024-02-03 MED ORDER — POTASSIUM PHOSPHATES 15 MMOLE/5ML IV SOLN
30.0000 mmol | Freq: Once | INTRAVENOUS | Status: AC
  Administered 2024-02-03 (×2): 30 mmol via INTRAVENOUS
  Filled 2024-02-03: qty 10

## 2024-02-03 MED ORDER — BACITRACIN ZINC 500 UNIT/GM EX OINT
TOPICAL_OINTMENT | Freq: Three times a day (TID) | CUTANEOUS | Status: DC
  Administered 2024-02-03 – 2024-02-23 (×114): 31.5 via TOPICAL
  Filled 2024-02-03: qty 28.4
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
## 2024-02-03 NOTE — Plan of Care (Signed)
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
## 2024-02-04 ENCOUNTER — Inpatient Hospital Stay (HOSPITAL_COMMUNITY)

## 2024-02-04 LAB — GLUCOSE, CAPILLARY
Glucose-Capillary: 95 mg/dL (ref 70–99)
Glucose-Capillary: 129 mg/dL — ABNORMAL HIGH (ref 70–99)
Glucose-Capillary: 122 mg/dL — ABNORMAL HIGH (ref 70–99)
Glucose-Capillary: 130 mg/dL — ABNORMAL HIGH (ref 70–99)
Glucose-Capillary: 121 mg/dL — ABNORMAL HIGH (ref 70–99)

## 2024-02-04 LAB — BASIC METABOLIC PANEL WITH GFR
Anion gap: 10 (ref 5–15)
BUN: 16 mg/dL (ref 8–23)
CO2: 29 mmol/L (ref 22–32)
Calcium: 9.5 mg/dL (ref 8.9–10.3)
Chloride: 105 mmol/L (ref 98–111)
Creatinine, Ser: 0.85 mg/dL (ref 0.61–1.24)
GFR, Estimated: >60 mL/min (ref >60)
Glucose, Bld: 112 mg/dL — ABNORMAL HIGH (ref 70–99)
Potassium: 4.1 mmol/L (ref 3.5–5.1)
Sodium: 144 mmol/L (ref 135–145)

## 2024-02-04 LAB — CBC
HCT: 22.6 % — ABNORMAL LOW (ref 39.0–52.0)
Hemoglobin: 7.2 g/dL — ABNORMAL LOW (ref 13.0–17.0)
MCH: 30 pg (ref 26.0–34.0)
MCHC: 31.9 g/dL (ref 30.0–36.0)
MCV: 94.2 fL (ref 80.0–100.0)
Platelets: 129 K/uL — ABNORMAL LOW (ref 150–400)
RBC: 2.4 MIL/uL — ABNORMAL LOW (ref 4.22–5.81)
RDW: 15.3 % (ref 11.5–15.5)
WBC: 7.9 K/uL (ref 4.0–10.5)
nRBC: 0.3 % — ABNORMAL HIGH (ref 0.0–0.2)

## 2024-02-04 LAB — PHOSPHORUS: Phosphorus: 3.5 mg/dL (ref 2.5–4.6)

## 2024-02-04 LAB — MAGNESIUM: Magnesium: 2 mg/dL (ref 1.7–2.4)

## 2024-02-04 MED ORDER — SERTRALINE HCL 50 MG PO TABS
50.0000 mg | ORAL_TABLET | Freq: Every day | ORAL | Status: DC
  Administered 2024-02-04 (×2): 50 mg via ORAL
  Filled 2024-02-04: qty 1
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
## 2024-02-04 NOTE — Progress Notes (Signed)
 Pt off unit for swallow study. Pt's son at bedside.
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
## 2024-02-04 NOTE — Progress Notes (Signed)
 Pt return to unit from [facility].

## 2024-02-04 NOTE — Plan of Care (Signed)
  Problem: Education: Goal: Knowledge of General Education information will improve Description: Including pain rating scale, medication(s)/side effects and non-pharmacologic comfort measures Outcome: Progressing   Problem: Clinical Measurements: Goal: Respiratory complications will improve Outcome: Progressing Goal: Cardiovascular complication will be avoided Outcome: Progressing   Problem: Clinical Measurements: Goal: Cardiovascular complication will be avoided Outcome: Progressing   Problem: Nutrition: Goal: Adequate nutrition will be maintained Outcome: Progressing   Problem: Coping: Goal: Level of anxiety will decrease Outcome: Progressing   Problem: Elimination: Goal: Will not experience complications related to urinary retention Outcome: Progressing

## 2024-02-05 LAB — CBC
HCT: 21.3 % — ABNORMAL LOW (ref 39.0–52.0)
HCT: 24.7 % — ABNORMAL LOW (ref 39.0–52.0)
Hemoglobin: 6.8 g/dL — CL (ref 13.0–17.0)
Hemoglobin: 7.9 g/dL — ABNORMAL LOW (ref 13.0–17.0)
MCH: 30.4 pg (ref 26.0–34.0)
MCH: 30.2 pg (ref 26.0–34.0)
MCHC: 31.9 g/dL (ref 30.0–36.0)
MCHC: 32 g/dL (ref 30.0–36.0)
MCV: 95.1 fL (ref 80.0–100.0)
MCV: 94.3 fL (ref 80.0–100.0)
Platelets: 150 K/uL (ref 150–400)
Platelets: 153 K/uL (ref 150–400)
RBC: 2.24 MIL/uL — ABNORMAL LOW (ref 4.22–5.81)
RBC: 2.62 MIL/uL — ABNORMAL LOW (ref 4.22–5.81)
RDW: 15.4 % (ref 11.5–15.5)
RDW: 15.4 % (ref 11.5–15.5)
WBC: 8.5 K/uL (ref 4.0–10.5)
WBC: 8.4 K/uL (ref 4.0–10.5)
nRBC: 0.2 % (ref 0.0–0.2)
nRBC: 0.2 % (ref 0.0–0.2)

## 2024-02-05 LAB — GLUCOSE, CAPILLARY
Glucose-Capillary: 127 mg/dL — ABNORMAL HIGH (ref 70–99)
Glucose-Capillary: 112 mg/dL — ABNORMAL HIGH (ref 70–99)
Glucose-Capillary: 114 mg/dL — ABNORMAL HIGH (ref 70–99)
Glucose-Capillary: 132 mg/dL — ABNORMAL HIGH (ref 70–99)
Glucose-Capillary: 126 mg/dL — ABNORMAL HIGH (ref 70–99)
Glucose-Capillary: 137 mg/dL — ABNORMAL HIGH (ref 70–99)
Glucose-Capillary: 115 mg/dL — ABNORMAL HIGH (ref 70–99)

## 2024-02-05 LAB — PREPARE RBC (CROSSMATCH)

## 2024-02-05 MED ORDER — SODIUM CHLORIDE 0.9% IV SOLUTION: Freq: Once | INTRAVENOUS | Status: AC

## 2024-02-05 MED ORDER — DOXAZOSIN MESYLATE 1 MG PO TABS
1.0000 mg | ORAL_TABLET | Freq: Every day | ORAL | Status: DC
  Administered 2024-02-05 – 2024-02-14 (×20): 1 mg
  Filled 2024-02-05 (×10): qty 1

## 2024-02-05 MED ORDER — SERTRALINE HCL 50 MG PO TABS
50.0000 mg | ORAL_TABLET | Freq: Every day | ORAL | Status: DC
  Administered 2024-02-05 – 2024-02-12 (×16): 50 mg
  Filled 2024-02-05 (×8): qty 1

## 2024-02-05 MED ORDER — FUROSEMIDE 10 MG/ML IJ SOLN
40.0000 mg | Freq: Once | INTRAMUSCULAR | Status: AC
  Administered 2024-02-05 (×2): 40 mg via INTRAVENOUS
  Filled 2024-02-05: qty 4

## 2024-02-05 MED ORDER — POLYETHYLENE GLYCOL 3350 17 G PO PACK
17.0000 g | PACK | Freq: Two times a day (BID) | ORAL | Status: DC
  Administered 2024-02-05 (×4): 17 g via ORAL
  Filled 2024-02-05 (×3): qty 1

## 2024-02-05 MED ORDER — GUAIFENESIN 100 MG/5ML PO LIQD
5.0000 mL | ORAL | Status: DC | PRN
  Administered 2024-02-05 – 2024-02-12 (×34): 5 mL
  Filled 2024-02-05 (×17): qty 10

## 2024-02-05 MED ORDER — DOCUSATE SODIUM 50 MG/5ML PO LIQD
100.0000 mg | Freq: Two times a day (BID) | ORAL | Status: DC
  Administered 2024-02-05 – 2024-02-14 (×20): 100 mg
  Filled 2024-02-05 (×12): qty 10

## 2024-02-05 MED ORDER — DOCUSATE SODIUM 100 MG PO CAPS: 100.0000 mg | ORAL_CAPSULE | Freq: Two times a day (BID) | ORAL | Status: DC
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
## 2024-02-05 NOTE — TOC Progression Note (Signed)
 Transition of Care [facility]) - Progression Note    Patient Details  Name: [name] [name] MRN: [id number] Date of Birth: February 14, 1934  [facility] CM/SW Contact  Aayushi Solorzano, Mliss HERO, RN Phone Number: 02/05/2024, 3:35pm  Clinical Narrative:    Telephone call to patient's son, per his request: he is inquiring about the process of getting his father to an inpatient geriatric psych facility, and when this might occur.  Explained that patient is currently not medically stable due to need for blood products and dysphagia requiring feeding tube.  Patient will remain in hospital until medically stable and facility available. He did inquire about possible facilities, mainly [facility]; made him aware that we would make every effort to keep him close to home, but it will depend on bed availability.  He is aware that CSW Meagan will be following up with him when facility is found and patient ready for transfer.  [place] is very appreciative of my call and the care being given to his father.    Expected Discharge Plan: Psychiatric Hospital Barriers to Discharge: Continued Medical Work up               Expected Discharge Plan and Services   Discharge Planning Services: CM Consult   Living arrangements for the past 2 months: Single Family Home                                       Social Drivers of Health (SDOH) Interventions SDOH Screenings   Housing: Low Risk  (01/31/2024)  Transportation Needs: No Transportation Needs (01/31/2024)  Utilities: Not At Risk (01/31/2024)  Social Connections: Moderately Isolated (01/31/2024)  Tobacco Use: Low Risk  (02/01/2024)    Readmission Risk Interventions     No data to display         Mliss MICAEL Fass, RN, BSN  Trauma/Neuro ICU Case Manager [phone]
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
## 2024-02-05 NOTE — Plan of Care (Signed)
  Problem: Education: Goal: Knowledge of General Education information will improve Description: Including pain rating scale, medication(s)/side effects and non-pharmacologic comfort measures 02/05/2024 0218 by Marvis Kenneth SAILOR, RN Outcome: Progressing 02/04/2024 1946 by Marvis Kenneth SAILOR, RN Outcome: Progressing   Problem: Health Behavior/Discharge Planning: Goal: Ability to manage health-related needs will improve 02/05/2024 0218 by Marvis Kenneth SAILOR, RN Outcome: Progressing 02/04/2024 1946 by Marvis Kenneth SAILOR, RN Outcome: Progressing   Problem: Clinical Measurements: Goal: Ability to maintain clinical measurements within normal limits will improve 02/05/2024 0218 by Marvis Kenneth SAILOR, RN Outcome: Progressing 02/04/2024 1946 by Marvis Kenneth SAILOR, RN Outcome: Progressing Goal: Will remain free from infection 02/05/2024 0218 by Marvis Kenneth SAILOR, RN Outcome: Progressing 02/04/2024 1946 by Marvis Kenneth SAILOR, RN Outcome: Progressing Goal: Diagnostic test results will improve 02/05/2024 0218 by Marvis Kenneth SAILOR, RN Outcome: Progressing 02/04/2024 1946 by Marvis Kenneth SAILOR, RN Outcome: Progressing Goal: Respiratory complications will improve 02/05/2024 0218 by Marvis Kenneth SAILOR, RN Outcome: Progressing 02/04/2024 1946 by Marvis Kenneth SAILOR, RN Outcome: Progressing Goal: Cardiovascular complication will be avoided 02/05/2024 0218 by Marvis Kenneth SAILOR, RN Outcome: Progressing 02/04/2024 1946 by Marvis Kenneth SAILOR, RN Outcome: Progressing   Problem: Activity: Goal: Risk for activity intolerance will decrease 02/05/2024 0218 by Marvis Kenneth SAILOR, RN Outcome: Progressing 02/04/2024 1946 by Marvis Kenneth SAILOR, RN Outcome: Progressing   Problem: Nutrition: Goal: Adequate nutrition will be maintained 02/05/2024 0218 by Marvis Kenneth SAILOR, RN Outcome: Progressing 02/04/2024 1946 by Marvis Kenneth SAILOR, RN Outcome: Progressing   Problem: Coping: Goal: Level of anxiety will decrease 02/05/2024  0218 by Marvis Kenneth SAILOR, RN Outcome: Progressing 02/04/2024 1946 by Marvis Kenneth SAILOR, RN Outcome: Progressing   Problem: Elimination: Goal: Will not experience complications related to bowel motility 02/05/2024 0218 by Marvis Kenneth SAILOR, RN Outcome: Progressing 02/04/2024 1946 by Marvis Kenneth SAILOR, RN Outcome: Progressing Goal: Will not experience complications related to urinary retention 02/05/2024 0218 by Marvis Kenneth SAILOR, RN Outcome: Progressing 02/04/2024 1946 by Marvis Kenneth SAILOR, RN Outcome: Progressing   Problem: Pain Managment: Goal: General experience of comfort will improve and/or be controlled 02/05/2024 0218 by Marvis Kenneth SAILOR, RN Outcome: Progressing 02/04/2024 1946 by Marvis Kenneth SAILOR, RN Outcome: Progressing   Problem: Safety: Goal: Ability to remain free from injury will improve 02/05/2024 0218 by Marvis Kenneth SAILOR, RN Outcome: Progressing 02/04/2024 1946 by Marvis Kenneth SAILOR, RN Outcome: Progressing   Problem: Skin Integrity: Goal: Risk for impaired skin integrity will decrease 02/05/2024 0218 by Marvis Kenneth SAILOR, RN Outcome: Progressing 02/04/2024 1946 by Marvis Kenneth SAILOR, RN Outcome: Progressing
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
## 2024-02-06 LAB — GLUCOSE, CAPILLARY
Glucose-Capillary: 125 mg/dL — ABNORMAL HIGH (ref 70–99)
Glucose-Capillary: 118 mg/dL — ABNORMAL HIGH (ref 70–99)
Glucose-Capillary: 132 mg/dL — ABNORMAL HIGH (ref 70–99)
Glucose-Capillary: 133 mg/dL — ABNORMAL HIGH (ref 70–99)
Glucose-Capillary: 133 mg/dL — ABNORMAL HIGH (ref 70–99)

## 2024-02-06 LAB — CBC
HCT: 23.9 % — ABNORMAL LOW (ref 39.0–52.0)
Hemoglobin: 7.6 g/dL — ABNORMAL LOW (ref 13.0–17.0)
MCH: 29.8 pg (ref 26.0–34.0)
MCHC: 31.8 g/dL (ref 30.0–36.0)
MCV: 93.7 fL (ref 80.0–100.0)
Platelets: 160 K/uL (ref 150–400)
RBC: 2.55 MIL/uL — ABNORMAL LOW (ref 4.22–5.81)
RDW: 15.8 % — ABNORMAL HIGH (ref 11.5–15.5)
WBC: 8.3 K/uL (ref 4.0–10.5)
nRBC: 0 % (ref 0.0–0.2)

## 2024-02-06 LAB — BPAM RBC
Blood Product Expiration Date: 202509092359
ISSUE DATE / TIME: 202509021402
Unit Type and Rh: 9500

## 2024-02-06 LAB — TYPE AND SCREEN
ABO/RH(D): O NEG
Antibody Screen: NEGATIVE
Unit division: 0

## 2024-02-06 MED ORDER — TRAZODONE HCL 50 MG PO TABS
50.0000 mg | ORAL_TABLET | Freq: Every day | ORAL | Status: DC
  Administered 2024-02-06 (×2): 50 mg via ORAL
  Filled 2024-02-06: qty 1

## 2024-02-06 MED ORDER — BETHANECHOL CHLORIDE 10 MG PO TABS
25.0000 mg | ORAL_TABLET | Freq: Three times a day (TID) | ORAL | Status: DC
  Administered 2024-02-06 – 2024-02-13 (×42): 25 mg
  Filled 2024-02-06 (×21): qty 3
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
## 2024-02-07 LAB — GLUCOSE, CAPILLARY
Glucose-Capillary: 117 mg/dL — ABNORMAL HIGH (ref 70–99)
Glucose-Capillary: 127 mg/dL — ABNORMAL HIGH (ref 70–99)
Glucose-Capillary: 123 mg/dL — ABNORMAL HIGH (ref 70–99)
Glucose-Capillary: 118 mg/dL — ABNORMAL HIGH (ref 70–99)
Glucose-Capillary: 98 mg/dL (ref 70–99)
Glucose-Capillary: 114 mg/dL — ABNORMAL HIGH (ref 70–99)
Glucose-Capillary: 126 mg/dL — ABNORMAL HIGH (ref 70–99)

## 2024-02-07 LAB — CBC
HCT: 24 % — ABNORMAL LOW (ref 39.0–52.0)
Hemoglobin: 7.6 g/dL — ABNORMAL LOW (ref 13.0–17.0)
MCH: 29.7 pg (ref 26.0–34.0)
MCHC: 31.7 g/dL (ref 30.0–36.0)
MCV: 93.8 fL (ref 80.0–100.0)
Platelets: 207 K/uL (ref 150–400)
RBC: 2.56 MIL/uL — ABNORMAL LOW (ref 4.22–5.81)
RDW: 15.4 % (ref 11.5–15.5)
WBC: 7.8 K/uL (ref 4.0–10.5)
nRBC: 0.3 % — ABNORMAL HIGH (ref 0.0–0.2)

## 2024-02-07 MED ORDER — DIPHENHYDRAMINE HCL 12.5 MG/5ML PO ELIX
12.5000 mg | ORAL_SOLUTION | Freq: Every evening | ORAL | Status: DC | PRN
  Administered 2024-02-07 – 2024-02-11 (×10): 12.5 mg
  Filled 2024-02-07 (×5): qty 5

## 2024-02-07 MED ORDER — TRAZODONE HCL 50 MG PO TABS
50.0000 mg | ORAL_TABLET | Freq: Every day | ORAL | Status: DC
  Administered 2024-02-07 – 2024-02-09 (×6): 50 mg
  Filled 2024-02-07 (×3): qty 1
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
## 2024-02-07 NOTE — TOC Progression Note (Signed)
 Transition of Care [facility]) - Progression Note    Patient Details  Name: [name] [name] MRN: [id number] Date of Birth: 21-Jun-1933  Transition of [facility] CM/SW Contact  Refujio Haymer E Norma Montemurro, LCSW Phone Number: 02/07/2024, 2:56 PM  Clinical Narrative:    Inpatient Care Management continues to follow.  CSW spoke with Linsey - [facility] AC. Bretta states they are following this patient and should be able to take him at the [facility] when medically ready - patient would need to be off tube feeds, able to take meds crushed and they can accommodate a pureed diet if no swallowing issues with it.    Expected Discharge Plan: Psychiatric Hospital Barriers to Discharge: Continued Medical Work up               Expected Discharge Plan and Services   Discharge Planning Services: CM Consult   Living arrangements for the past 2 months: Single Family Home                                       Social Drivers of Health (SDOH) Interventions SDOH Screenings   Housing: Low Risk  (01/31/2024)  Transportation Needs: No Transportation Needs (01/31/2024)  Utilities: Not At Risk (01/31/2024)  Social Connections: Moderately Isolated (01/31/2024)  Tobacco Use: Low Risk  (02/01/2024)    Readmission Risk Interventions     No data to display

## 2024-02-07 NOTE — Care Management Important Message (Signed)
 Important Message  Patient Details  Name: [name] [name] MRN: [id number] Date of Birth: Oct 15, 1933   Important Message Given:  Yes - Medicare IM     Jon Cruel 02/07/2024, 2:04 PM
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
## 2024-02-08 LAB — GLUCOSE, CAPILLARY
Glucose-Capillary: 119 mg/dL — ABNORMAL HIGH (ref 70–99)
Glucose-Capillary: 117 mg/dL — ABNORMAL HIGH (ref 70–99)
Glucose-Capillary: 159 mg/dL — ABNORMAL HIGH (ref 70–99)
Glucose-Capillary: 116 mg/dL — ABNORMAL HIGH (ref 70–99)
Glucose-Capillary: 124 mg/dL — ABNORMAL HIGH (ref 70–99)

## 2024-02-08 LAB — CBC
HCT: 25.5 % — ABNORMAL LOW (ref 39.0–52.0)
Hemoglobin: 8 g/dL — ABNORMAL LOW (ref 13.0–17.0)
MCH: 29.4 pg (ref 26.0–34.0)
MCHC: 31.4 g/dL (ref 30.0–36.0)
MCV: 93.8 fL (ref 80.0–100.0)
Platelets: 239 K/uL (ref 150–400)
RBC: 2.72 MIL/uL — ABNORMAL LOW (ref 4.22–5.81)
RDW: 15.4 % (ref 11.5–15.5)
WBC: 7.6 K/uL (ref 4.0–10.5)
nRBC: 0 % (ref 0.0–0.2)

## 2024-02-08 MED ORDER — ENOXAPARIN SODIUM 30 MG/0.3ML IJ SOSY
30.0000 mg | PREFILLED_SYRINGE | Freq: Two times a day (BID) | INTRAMUSCULAR | Status: DC
  Administered 2024-02-08 – 2024-02-23 (×60): 30 mg via SUBCUTANEOUS
  Filled 2024-02-08 (×29): qty 0.3

## 2024-02-08 MED ORDER — NEPRO/CARBSTEADY PO LIQD
237.0000 mL | Freq: Two times a day (BID) | ORAL | Status: DC
  Administered 2024-02-09 – 2024-02-12 (×14): 237 mL via ORAL

## 2024-02-08 MED ORDER — POLYETHYLENE GLYCOL 3350 17 G PO PACK
17.0000 g | PACK | Freq: Two times a day (BID) | ORAL | Status: DC
  Administered 2024-02-08 – 2024-02-13 (×14): 17 g
  Filled 2024-02-08 (×8): qty 1
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
## 2024-02-09 LAB — CBC
HCT: 25.2 % — ABNORMAL LOW (ref 39.0–52.0)
Hemoglobin: 7.9 g/dL — ABNORMAL LOW (ref 13.0–17.0)
MCH: 29.5 pg (ref 26.0–34.0)
MCHC: 31.3 g/dL (ref 30.0–36.0)
MCV: 94 fL (ref 80.0–100.0)
Platelets: 275 K/uL (ref 150–400)
RBC: 2.68 MIL/uL — ABNORMAL LOW (ref 4.22–5.81)
RDW: 15.3 % (ref 11.5–15.5)
WBC: 7.2 K/uL (ref 4.0–10.5)
nRBC: 0 % (ref 0.0–0.2)

## 2024-02-09 LAB — GLUCOSE, CAPILLARY
Glucose-Capillary: 115 mg/dL — ABNORMAL HIGH (ref 70–99)
Glucose-Capillary: 117 mg/dL — ABNORMAL HIGH (ref 70–99)
Glucose-Capillary: 126 mg/dL — ABNORMAL HIGH (ref 70–99)
Glucose-Capillary: 131 mg/dL — ABNORMAL HIGH (ref 70–99)
Glucose-Capillary: 99 mg/dL (ref 70–99)

## 2024-02-09 NOTE — Plan of Care (Signed)
  Problem: Education: Goal: Knowledge of General Education information will improve Description: Including pain rating scale, medication(s)/side effects and non-pharmacologic comfort measures 02/09/2024 0327 by Marvis Kenneth SAILOR, RN Outcome: Progressing 02/09/2024 0112 by Marvis Kenneth SAILOR, RN Outcome: Progressing   Problem: Health Behavior/Discharge Planning: Goal: Ability to manage health-related needs will improve 02/09/2024 0327 by Marvis Kenneth SAILOR, RN Outcome: Progressing 02/09/2024 0112 by Marvis Kenneth SAILOR, RN Outcome: Progressing   Problem: Clinical Measurements: Goal: Ability to maintain clinical measurements within normal limits will improve 02/09/2024 0327 by Marvis Kenneth SAILOR, RN Outcome: Progressing 02/09/2024 0112 by Marvis Kenneth SAILOR, RN Outcome: Progressing Goal: Will remain free from infection 02/09/2024 0327 by Marvis Kenneth SAILOR, RN Outcome: Progressing 02/09/2024 0112 by Marvis Kenneth SAILOR, RN Outcome: Progressing Goal: Diagnostic test results will improve 02/09/2024 0327 by Marvis Kenneth SAILOR, RN Outcome: Progressing 02/09/2024 0112 by Marvis Kenneth SAILOR, RN Outcome: Progressing Goal: Respiratory complications will improve 02/09/2024 0327 by Marvis Kenneth SAILOR, RN Outcome: Progressing 02/09/2024 0112 by Marvis Kenneth SAILOR, RN Outcome: Progressing Goal: Cardiovascular complication will be avoided 02/09/2024 0327 by Marvis Kenneth SAILOR, RN Outcome: Progressing 02/09/2024 0112 by Marvis Kenneth SAILOR, RN Outcome: Progressing   Problem: Activity: Goal: Risk for activity intolerance will decrease 02/09/2024 0327 by Marvis Kenneth SAILOR, RN Outcome: Progressing 02/09/2024 0112 by Marvis Kenneth SAILOR, RN Outcome: Progressing   Problem: Nutrition: Goal: Adequate nutrition will be maintained 02/09/2024 0327 by Marvis Kenneth SAILOR, RN Outcome: Progressing 02/09/2024 0112 by Marvis Kenneth SAILOR, RN Outcome: Progressing   Problem: Coping: Goal: Level of anxiety will decrease 02/09/2024  0327 by Marvis Kenneth SAILOR, RN Outcome: Progressing 02/09/2024 0112 by Marvis Kenneth SAILOR, RN Outcome: Progressing   Problem: Elimination: Goal: Will not experience complications related to bowel motility 02/09/2024 0327 by Marvis Kenneth SAILOR, RN Outcome: Progressing 02/09/2024 0112 by Marvis Kenneth SAILOR, RN Outcome: Progressing Goal: Will not experience complications related to urinary retention 02/09/2024 0327 by Marvis Kenneth SAILOR, RN Outcome: Progressing 02/09/2024 0112 by Marvis Kenneth SAILOR, RN Outcome: Progressing   Problem: Pain Managment: Goal: General experience of comfort will improve and/or be controlled 02/09/2024 0327 by Marvis Kenneth SAILOR, RN Outcome: Progressing 02/09/2024 0112 by Marvis Kenneth SAILOR, RN Outcome: Progressing   Problem: Safety: Goal: Ability to remain free from injury will improve 02/09/2024 0327 by Marvis Kenneth SAILOR, RN Outcome: Progressing 02/09/2024 0112 by Marvis Kenneth SAILOR, RN Outcome: Progressing   Problem: Skin Integrity: Goal: Risk for impaired skin integrity will decrease 02/09/2024 0327 by Marvis Kenneth SAILOR, RN Outcome: Progressing 02/09/2024 0112 by Marvis Kenneth SAILOR, RN Outcome: Progressing
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
## 2024-02-10 DIAGNOSIS — F329 Major depressive disorder, single episode, unspecified: Secondary | ICD-10-CM | POA: Diagnosis not present

## 2024-02-10 DIAGNOSIS — F332 Major depressive disorder, recurrent severe without psychotic features: Secondary | ICD-10-CM

## 2024-02-10 LAB — GLUCOSE, CAPILLARY
Glucose-Capillary: 101 mg/dL — ABNORMAL HIGH (ref 70–99)
Glucose-Capillary: 103 mg/dL — ABNORMAL HIGH (ref 70–99)
Glucose-Capillary: 115 mg/dL — ABNORMAL HIGH (ref 70–99)
Glucose-Capillary: 117 mg/dL — ABNORMAL HIGH (ref 70–99)
Glucose-Capillary: 119 mg/dL — ABNORMAL HIGH (ref 70–99)
Glucose-Capillary: 126 mg/dL — ABNORMAL HIGH (ref 70–99)
Glucose-Capillary: 131 mg/dL — ABNORMAL HIGH (ref 70–99)
Glucose-Capillary: 135 mg/dL — ABNORMAL HIGH (ref 70–99)

## 2024-02-10 LAB — CBC
HCT: 26.6 % — ABNORMAL LOW (ref 39.0–52.0)
Hemoglobin: 8.3 g/dL — ABNORMAL LOW (ref 13.0–17.0)
MCH: 29.7 pg (ref 26.0–34.0)
MCHC: 31.2 g/dL (ref 30.0–36.0)
MCV: 95.3 fL (ref 80.0–100.0)
Platelets: 275 K/uL (ref 150–400)
RBC: 2.79 MIL/uL — ABNORMAL LOW (ref 4.22–5.81)
RDW: 15.2 % (ref 11.5–15.5)
WBC: 7.1 K/uL (ref 4.0–10.5)
nRBC: 0.3 % — ABNORMAL HIGH (ref 0.0–0.2)

## 2024-02-10 MED ORDER — RAMELTEON 8 MG PO TABS
8.0000 mg | ORAL_TABLET | Freq: Every day | ORAL | Status: DC
  Administered 2024-02-10 – 2024-02-14 (×10): 8 mg
  Filled 2024-02-10 (×5): qty 1
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
## 2024-02-10 NOTE — Plan of Care (Signed)
  Problem: Education: Goal: Knowledge of General Education information will improve Description: Including pain rating scale, medication(s)/side effects and non-pharmacologic comfort measures 02/10/2024 0018 by Marvis Kenneth SAILOR, RN Outcome: Progressing 02/09/2024 2027 by Marvis Kenneth SAILOR, RN Outcome: Progressing   Problem: Health Behavior/Discharge Planning: Goal: Ability to manage health-related needs will improve 02/10/2024 0018 by Marvis Kenneth SAILOR, RN Outcome: Progressing 02/09/2024 2027 by Marvis Kenneth SAILOR, RN Outcome: Progressing   Problem: Clinical Measurements: Goal: Ability to maintain clinical measurements within normal limits will improve 02/10/2024 0018 by Marvis Kenneth SAILOR, RN Outcome: Progressing 02/09/2024 2027 by Marvis Kenneth SAILOR, RN Outcome: Progressing Goal: Will remain free from infection 02/10/2024 0018 by Marvis Kenneth SAILOR, RN Outcome: Progressing 02/09/2024 2027 by Marvis Kenneth SAILOR, RN Outcome: Progressing Goal: Diagnostic test results will improve 02/10/2024 0018 by Marvis Kenneth SAILOR, RN Outcome: Progressing 02/09/2024 2027 by Marvis Kenneth SAILOR, RN Outcome: Progressing Goal: Respiratory complications will improve 02/10/2024 0018 by Marvis Kenneth SAILOR, RN Outcome: Progressing 02/09/2024 2027 by Marvis Kenneth SAILOR, RN Outcome: Progressing Goal: Cardiovascular complication will be avoided 02/10/2024 0018 by Marvis Kenneth SAILOR, RN Outcome: Progressing 02/09/2024 2027 by Marvis Kenneth SAILOR, RN Outcome: Progressing   Problem: Activity: Goal: Risk for activity intolerance will decrease 02/10/2024 0018 by Marvis Kenneth SAILOR, RN Outcome: Progressing 02/09/2024 2027 by Marvis Kenneth SAILOR, RN Outcome: Progressing   Problem: Nutrition: Goal: Adequate nutrition will be maintained 02/10/2024 0018 by Marvis Kenneth SAILOR, RN Outcome: Progressing 02/09/2024 2027 by Marvis Kenneth SAILOR, RN Outcome: Progressing   Problem: Coping: Goal: Level of anxiety will decrease 02/10/2024  0018 by Marvis Kenneth SAILOR, RN Outcome: Progressing 02/09/2024 2027 by Marvis Kenneth SAILOR, RN Outcome: Progressing   Problem: Elimination: Goal: Will not experience complications related to bowel motility 02/10/2024 0018 by Marvis Kenneth SAILOR, RN Outcome: Progressing 02/09/2024 2027 by Marvis Kenneth SAILOR, RN Outcome: Progressing Goal: Will not experience complications related to urinary retention 02/10/2024 0018 by Marvis Kenneth SAILOR, RN Outcome: Progressing 02/09/2024 2027 by Marvis Kenneth SAILOR, RN Outcome: Progressing   Problem: Pain Managment: Goal: General experience of comfort will improve and/or be controlled 02/10/2024 0018 by Marvis Kenneth SAILOR, RN Outcome: Progressing 02/09/2024 2027 by Marvis Kenneth SAILOR, RN Outcome: Progressing   Problem: Safety: Goal: Ability to remain free from injury will improve 02/10/2024 0018 by Marvis Kenneth SAILOR, RN Outcome: Progressing 02/09/2024 2027 by Marvis Kenneth SAILOR, RN Outcome: Progressing   Problem: Skin Integrity: Goal: Risk for impaired skin integrity will decrease 02/10/2024 0018 by Marvis Kenneth SAILOR, RN Outcome: Progressing 02/09/2024 2027 by Marvis Kenneth SAILOR, RN Outcome: Progressing
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
## 2024-02-11 LAB — CBC
HCT: 30 % — ABNORMAL LOW (ref 39.0–52.0)
Hemoglobin: 9.3 g/dL — ABNORMAL LOW (ref 13.0–17.0)
MCH: 29 pg (ref 26.0–34.0)
MCHC: 31 g/dL (ref 30.0–36.0)
MCV: 93.5 fL (ref 80.0–100.0)
Platelets: 407 K/uL — ABNORMAL HIGH (ref 150–400)
RBC: 3.21 MIL/uL — ABNORMAL LOW (ref 4.22–5.81)
RDW: 15.2 % (ref 11.5–15.5)
WBC: 10.5 K/uL (ref 4.0–10.5)
nRBC: 0 % (ref 0.0–0.2)

## 2024-02-11 LAB — GLUCOSE, CAPILLARY
Glucose-Capillary: 110 mg/dL — ABNORMAL HIGH (ref 70–99)
Glucose-Capillary: 138 mg/dL — ABNORMAL HIGH (ref 70–99)
Glucose-Capillary: 153 mg/dL — ABNORMAL HIGH (ref 70–99)
Glucose-Capillary: 141 mg/dL — ABNORMAL HIGH (ref 70–99)
Glucose-Capillary: 121 mg/dL — ABNORMAL HIGH (ref 70–99)

## 2024-02-11 MED ORDER — ACETAMINOPHEN 325 MG PO TABS
650.0000 mg | ORAL_TABLET | Freq: Four times a day (QID) | ORAL | Status: DC
  Administered 2024-02-11 – 2024-02-14 (×26): 650 mg
  Filled 2024-02-11 (×13): qty 2
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
## 2024-02-11 NOTE — Plan of Care (Signed)
 Problem: Education: Goal: Knowledge of General Education information will improve Description: Including pain rating scale, medication(s)/side effects and non-pharmacologic comfort measures 02/11/2024 0015 by Marvis Kenneth SAILOR, RN Outcome: Progressing 02/10/2024 2011 by Marvis Kenneth SAILOR, RN Outcome: Progressing 02/10/2024 2008 by Marvis Kenneth SAILOR, RN Outcome: Progressing   Problem: Health Behavior/Discharge Planning: Goal: Ability to manage health-related needs will improve 02/11/2024 0015 by Marvis Kenneth SAILOR, RN Outcome: Progressing 02/10/2024 2011 by Marvis Kenneth SAILOR, RN Outcome: Progressing 02/10/2024 2008 by Marvis Kenneth SAILOR, RN Outcome: Progressing   Problem: Clinical Measurements: Goal: Ability to maintain clinical measurements within normal limits will improve 02/11/2024 0015 by Marvis Kenneth SAILOR, RN Outcome: Progressing 02/10/2024 2011 by Marvis Kenneth SAILOR, RN Outcome: Progressing 02/10/2024 2008 by Marvis Kenneth SAILOR, RN Outcome: Progressing Goal: Will remain free from infection 02/11/2024 0015 by Marvis Kenneth SAILOR, RN Outcome: Progressing 02/10/2024 2011 by Marvis Kenneth SAILOR, RN Outcome: Progressing 02/10/2024 2008 by Marvis Kenneth SAILOR, RN Outcome: Progressing Goal: Diagnostic test results will improve 02/11/2024 0015 by Marvis Kenneth SAILOR, RN Outcome: Progressing 02/10/2024 2011 by Marvis Kenneth SAILOR, RN Outcome: Progressing 02/10/2024 2008 by Marvis Kenneth SAILOR, RN Outcome: Progressing Goal: Respiratory complications will improve 02/11/2024 0015 by Marvis Kenneth SAILOR, RN Outcome: Progressing 02/10/2024 2011 by Marvis Kenneth SAILOR, RN Outcome: Progressing 02/10/2024 2008 by Marvis Kenneth SAILOR, RN Outcome: Progressing Goal: Cardiovascular complication will be avoided 02/11/2024 0015 by Marvis Kenneth SAILOR, RN Outcome: Progressing 02/10/2024 2011 by Marvis Kenneth SAILOR, RN Outcome: Progressing 02/10/2024 2008 by Marvis Kenneth SAILOR, RN Outcome: Progressing   Problem:  Activity: Goal: Risk for activity intolerance will decrease 02/11/2024 0015 by Marvis Kenneth SAILOR, RN Outcome: Progressing 02/10/2024 2011 by Marvis Kenneth SAILOR, RN Outcome: Progressing 02/10/2024 2008 by Marvis Kenneth SAILOR, RN Outcome: Progressing   Problem: Nutrition: Goal: Adequate nutrition will be maintained 02/11/2024 0015 by Marvis Kenneth SAILOR, RN Outcome: Progressing 02/10/2024 2011 by Marvis Kenneth SAILOR, RN Outcome: Progressing 02/10/2024 2008 by Marvis Kenneth SAILOR, RN Outcome: Progressing   Problem: Coping: Goal: Level of anxiety will decrease 02/11/2024 0015 by Marvis Kenneth SAILOR, RN Outcome: Progressing 02/10/2024 2011 by Marvis Kenneth SAILOR, RN Outcome: Progressing 02/10/2024 2008 by Marvis Kenneth SAILOR, RN Outcome: Progressing   Problem: Elimination: Goal: Will not experience complications related to bowel motility 02/11/2024 0015 by Marvis Kenneth SAILOR, RN Outcome: Progressing 02/10/2024 2011 by Marvis Kenneth SAILOR, RN Outcome: Progressing 02/10/2024 2008 by Marvis Kenneth SAILOR, RN Outcome: Progressing Goal: Will not experience complications related to urinary retention 02/11/2024 0015 by Marvis Kenneth SAILOR, RN Outcome: Progressing 02/10/2024 2011 by Marvis Kenneth SAILOR, RN Outcome: Progressing 02/10/2024 2008 by Marvis Kenneth SAILOR, RN Outcome: Progressing   Problem: Pain Managment: Goal: General experience of comfort will improve and/or be controlled 02/11/2024 0015 by Marvis Kenneth SAILOR, RN Outcome: Progressing 02/10/2024 2011 by Marvis Kenneth SAILOR, RN Outcome: Progressing 02/10/2024 2008 by Marvis Kenneth SAILOR, RN Outcome: Progressing   Problem: Safety: Goal: Ability to remain free from injury will improve 02/11/2024 0015 by Marvis Kenneth SAILOR, RN Outcome: Progressing 02/10/2024 2011 by Marvis Kenneth SAILOR, RN Outcome: Progressing 02/10/2024 2008 by Marvis Kenneth SAILOR, RN Outcome: Progressing   Problem: Skin Integrity: Goal: Risk for impaired skin integrity will decrease 02/11/2024 0015  by Marvis Kenneth SAILOR, RN Outcome: Progressing 02/10/2024 2011 by Marvis Kenneth SAILOR, RN Outcome: Progressing 02/10/2024 2008 by Marvis Kenneth SAILOR, RN Outcome: Progressing   Problem: Education: Goal: Knowledge of General Education information will improve Description: Including pain rating scale, medication(s)/side effects and non-pharmacologic comfort measures 02/11/2024 0015  by Marvis Kenneth SAILOR, RN Outcome: Progressing 02/10/2024 2011 by Marvis Kenneth SAILOR, RN Outcome: Progressing 02/10/2024 2008 by Marvis Kenneth SAILOR, RN Outcome: Progressing   Problem: Health Behavior/Discharge Planning: Goal: Ability to manage health-related needs will improve 02/11/2024 0015 by Marvis Kenneth SAILOR, RN Outcome: Progressing 02/10/2024 2011 by Marvis Kenneth SAILOR, RN Outcome: Progressing 02/10/2024 2008 by Marvis Kenneth SAILOR, RN Outcome: Progressing   Problem: Clinical Measurements: Goal: Ability to maintain clinical measurements within normal limits will improve 02/11/2024 0015 by Marvis Kenneth SAILOR, RN Outcome: Progressing 02/10/2024 2011 by Marvis Kenneth SAILOR, RN Outcome: Progressing 02/10/2024 2008 by Marvis Kenneth SAILOR, RN Outcome: Progressing Goal: Will remain free from infection 02/11/2024 0015 by Marvis Kenneth SAILOR, RN Outcome: Progressing 02/10/2024 2011 by Marvis Kenneth SAILOR, RN Outcome: Progressing 02/10/2024 2008 by Marvis Kenneth SAILOR, RN Outcome: Progressing Goal: Diagnostic test results will improve 02/11/2024 0015 by Marvis Kenneth SAILOR, RN Outcome: Progressing 02/10/2024 2011 by Marvis Kenneth SAILOR, RN Outcome: Progressing 02/10/2024 2008 by Marvis Kenneth SAILOR, RN Outcome: Progressing Goal: Respiratory complications will improve 02/11/2024 0015 by Marvis Kenneth SAILOR, RN Outcome: Progressing 02/10/2024 2011 by Marvis Kenneth SAILOR, RN Outcome: Progressing 02/10/2024 2008 by Marvis Kenneth SAILOR, RN Outcome: Progressing Goal: Cardiovascular complication will be avoided 02/11/2024 0015 by Marvis Kenneth SAILOR,  RN Outcome: Progressing 02/10/2024 2011 by Marvis Kenneth SAILOR, RN Outcome: Progressing 02/10/2024 2008 by Marvis Kenneth SAILOR, RN Outcome: Progressing   Problem: Activity: Goal: Risk for activity intolerance will decrease 02/11/2024 0015 by Marvis Kenneth SAILOR, RN Outcome: Progressing 02/10/2024 2011 by Marvis Kenneth SAILOR, RN Outcome: Progressing 02/10/2024 2008 by Marvis Kenneth SAILOR, RN Outcome: Progressing   Problem: Nutrition: Goal: Adequate nutrition will be maintained 02/11/2024 0015 by Marvis Kenneth SAILOR, RN Outcome: Progressing 02/10/2024 2011 by Marvis Kenneth SAILOR, RN Outcome: Progressing 02/10/2024 2008 by Marvis Kenneth SAILOR, RN Outcome: Progressing   Problem: Coping: Goal: Level of anxiety will decrease 02/11/2024 0015 by Marvis Kenneth SAILOR, RN Outcome: Progressing 02/10/2024 2011 by Marvis Kenneth SAILOR, RN Outcome: Progressing 02/10/2024 2008 by Marvis Kenneth SAILOR, RN Outcome: Progressing   Problem: Elimination: Goal: Will not experience complications related to bowel motility 02/11/2024 0015 by Marvis Kenneth SAILOR, RN Outcome: Progressing 02/10/2024 2011 by Marvis Kenneth SAILOR, RN Outcome: Progressing 02/10/2024 2008 by Marvis Kenneth SAILOR, RN Outcome: Progressing Goal: Will not experience complications related to urinary retention 02/11/2024 0015 by Marvis Kenneth SAILOR, RN Outcome: Progressing 02/10/2024 2011 by Marvis Kenneth SAILOR, RN Outcome: Progressing 02/10/2024 2008 by Marvis Kenneth SAILOR, RN Outcome: Progressing   Problem: Pain Managment: Goal: General experience of comfort will improve and/or be controlled 02/11/2024 0015 by Marvis Kenneth SAILOR, RN Outcome: Progressing 02/10/2024 2011 by Marvis Kenneth SAILOR, RN Outcome: Progressing 02/10/2024 2008 by Marvis Kenneth SAILOR, RN Outcome: Progressing   Problem: Safety: Goal: Ability to remain free from injury will improve 02/11/2024 0015 by Marvis Kenneth SAILOR, RN Outcome: Progressing 02/10/2024 2011 by Marvis Kenneth SAILOR, RN Outcome:  Progressing 02/10/2024 2008 by Marvis Kenneth SAILOR, RN Outcome: Progressing   Problem: Skin Integrity: Goal: Risk for impaired skin integrity will decrease 02/11/2024 0015 by Marvis Kenneth SAILOR, RN Outcome: Progressing 02/10/2024 2011 by Marvis Kenneth SAILOR, RN Outcome: Progressing 02/10/2024 2008 by Marvis Kenneth SAILOR, RN Outcome: Progressing
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
## 2024-02-11 NOTE — Plan of Care (Signed)
 Problem: Education: Goal: Knowledge of General Education information will improve Description: Including pain rating scale, medication(s)/side effects and non-pharmacologic comfort measures 02/11/2024 0343 by Marvis Kenneth SAILOR, RN Outcome: Progressing 02/11/2024 0015 by Marvis Kenneth SAILOR, RN Outcome: Progressing 02/10/2024 2011 by Marvis Kenneth SAILOR, RN Outcome: Progressing 02/10/2024 2008 by Marvis Kenneth SAILOR, RN Outcome: Progressing   Problem: Health Behavior/Discharge Planning: Goal: Ability to manage health-related needs will improve 02/11/2024 0343 by Marvis Kenneth SAILOR, RN Outcome: Progressing 02/11/2024 0015 by Marvis Kenneth SAILOR, RN Outcome: Progressing 02/10/2024 2011 by Marvis Kenneth SAILOR, RN Outcome: Progressing 02/10/2024 2008 by Marvis Kenneth SAILOR, RN Outcome: Progressing   Problem: Clinical Measurements: Goal: Ability to maintain clinical measurements within normal limits will improve 02/11/2024 0343 by Marvis Kenneth SAILOR, RN Outcome: Progressing 02/11/2024 0015 by Marvis Kenneth SAILOR, RN Outcome: Progressing 02/10/2024 2011 by Marvis Kenneth SAILOR, RN Outcome: Progressing 02/10/2024 2008 by Marvis Kenneth SAILOR, RN Outcome: Progressing Goal: Will remain free from infection 02/11/2024 0343 by Marvis Kenneth SAILOR, RN Outcome: Progressing 02/11/2024 0015 by Marvis Kenneth SAILOR, RN Outcome: Progressing 02/10/2024 2011 by Marvis Kenneth SAILOR, RN Outcome: Progressing 02/10/2024 2008 by Marvis Kenneth SAILOR, RN Outcome: Progressing Goal: Diagnostic test results will improve 02/11/2024 0343 by Marvis Kenneth SAILOR, RN Outcome: Progressing 02/11/2024 0015 by Marvis Kenneth SAILOR, RN Outcome: Progressing 02/10/2024 2011 by Marvis Kenneth SAILOR, RN Outcome: Progressing 02/10/2024 2008 by Marvis Kenneth SAILOR, RN Outcome: Progressing Goal: Respiratory complications will improve 02/11/2024 0343 by Marvis Kenneth SAILOR, RN Outcome: Progressing 02/11/2024 0015 by Marvis Kenneth SAILOR, RN Outcome: Progressing 02/10/2024  2011 by Marvis Kenneth SAILOR, RN Outcome: Progressing 02/10/2024 2008 by Marvis Kenneth SAILOR, RN Outcome: Progressing Goal: Cardiovascular complication will be avoided 02/11/2024 0343 by Marvis Kenneth SAILOR, RN Outcome: Progressing 02/11/2024 0015 by Marvis Kenneth SAILOR, RN Outcome: Progressing 02/10/2024 2011 by Marvis Kenneth SAILOR, RN Outcome: Progressing 02/10/2024 2008 by Marvis Kenneth SAILOR, RN Outcome: Progressing   Problem: Activity: Goal: Risk for activity intolerance will decrease 02/11/2024 0343 by Marvis Kenneth SAILOR, RN Outcome: Progressing 02/11/2024 0015 by Marvis Kenneth SAILOR, RN Outcome: Progressing 02/10/2024 2011 by Marvis Kenneth SAILOR, RN Outcome: Progressing 02/10/2024 2008 by Marvis Kenneth SAILOR, RN Outcome: Progressing   Problem: Nutrition: Goal: Adequate nutrition will be maintained 02/11/2024 0343 by Marvis Kenneth SAILOR, RN Outcome: Progressing 02/11/2024 0015 by Marvis Kenneth SAILOR, RN Outcome: Progressing 02/10/2024 2011 by Marvis Kenneth SAILOR, RN Outcome: Progressing 02/10/2024 2008 by Marvis Kenneth SAILOR, RN Outcome: Progressing   Problem: Coping: Goal: Level of anxiety will decrease 02/11/2024 0343 by Marvis Kenneth SAILOR, RN Outcome: Progressing 02/11/2024 0015 by Marvis Kenneth SAILOR, RN Outcome: Progressing 02/10/2024 2011 by Marvis Kenneth SAILOR, RN Outcome: Progressing 02/10/2024 2008 by Marvis Kenneth SAILOR, RN Outcome: Progressing   Problem: Elimination: Goal: Will not experience complications related to bowel motility 02/11/2024 0343 by Marvis Kenneth SAILOR, RN Outcome: Progressing 02/11/2024 0015 by Marvis Kenneth SAILOR, RN Outcome: Progressing 02/10/2024 2011 by Marvis Kenneth SAILOR, RN Outcome: Progressing 02/10/2024 2008 by Marvis Kenneth SAILOR, RN Outcome: Progressing Goal: Will not experience complications related to urinary retention 02/11/2024 0343 by Marvis Kenneth SAILOR, RN Outcome: Progressing 02/11/2024 0015 by Marvis Kenneth SAILOR, RN Outcome: Progressing 02/10/2024 2011 by Marvis Kenneth SAILOR, RN Outcome: Progressing 02/10/2024 2008 by Marvis Kenneth SAILOR, RN Outcome: Progressing   Problem: Pain Managment: Goal: General experience of comfort will improve and/or be controlled 02/11/2024 0343 by Marvis Kenneth SAILOR, RN Outcome: Progressing 02/11/2024 0015 by Marvis Kenneth SAILOR, RN Outcome: Progressing 02/10/2024 2011 by Marvis Kenneth SAILOR,  RN Outcome: Progressing 02/10/2024 2008 by Marvis Kenneth SAILOR, RN Outcome: Progressing   Problem: Safety: Goal: Ability to remain free from injury will improve 02/11/2024 0343 by Marvis Kenneth SAILOR, RN Outcome: Progressing 02/11/2024 0015 by Marvis Kenneth SAILOR, RN Outcome: Progressing 02/10/2024 2011 by Marvis Kenneth SAILOR, RN Outcome: Progressing 02/10/2024 2008 by Marvis Kenneth SAILOR, RN Outcome: Progressing   Problem: Skin Integrity: Goal: Risk for impaired skin integrity will decrease 02/11/2024 0343 by Marvis Kenneth SAILOR, RN Outcome: Progressing 02/11/2024 0015 by Marvis Kenneth SAILOR, RN Outcome: Progressing 02/10/2024 2011 by Marvis Kenneth SAILOR, RN Outcome: Progressing 02/10/2024 2008 by Marvis Kenneth SAILOR, RN Outcome: Progressing

## 2024-02-11 NOTE — TOC Progression Note (Signed)
 Transition of Care [facility]) - Progression Note    Patient Details  Name: [name] [name] MRN: [id number] Date of Birth: [birth year]  Transition [facility]) CM/SW Contact  Calen Geister E Annissa Andreoni, LCSW Phone Number: 02/11/2024, 3:13 PM  Clinical Narrative:    Patient reviewed in trauma rounds. SLP plans to complete repeat swallow study tomorrow. CSW reached out to [facility] to inquire if they are able to accept patient on a full liquid diet if he could take his meds crushed in puree, awaiting response.   Expected Discharge Plan: Psychiatric Hospital Barriers to Discharge: Continued Medical Work up               Expected Discharge Plan and Services   Discharge Planning Services: CM Consult   Living arrangements for the past 2 months: Single Family Home                                       Social Drivers of Health (SDOH) Interventions SDOH Screenings   Housing: Low Risk  (01/31/2024)  Transportation Needs: No Transportation Needs (01/31/2024)  Utilities: Not At Risk (01/31/2024)  Social Connections: Moderately Isolated (01/31/2024)  Tobacco Use: Low Risk  (02/01/2024)    Readmission Risk Interventions     No data to display

## 2024-02-12 ENCOUNTER — Inpatient Hospital Stay (HOSPITAL_COMMUNITY)

## 2024-02-12 DIAGNOSIS — W3400XA Accidental discharge from unspecified firearms or gun, initial encounter: Secondary | ICD-10-CM | POA: Diagnosis not present

## 2024-02-12 DIAGNOSIS — F332 Major depressive disorder, recurrent severe without psychotic features: Secondary | ICD-10-CM | POA: Diagnosis not present

## 2024-02-12 DIAGNOSIS — R45851 Suicidal ideations: Secondary | ICD-10-CM | POA: Diagnosis not present

## 2024-02-12 LAB — GLUCOSE, CAPILLARY
Glucose-Capillary: 123 mg/dL — ABNORMAL HIGH (ref 70–99)
Glucose-Capillary: 130 mg/dL — ABNORMAL HIGH (ref 70–99)
Glucose-Capillary: 105 mg/dL — ABNORMAL HIGH (ref 70–99)
Glucose-Capillary: 155 mg/dL — ABNORMAL HIGH (ref 70–99)
Glucose-Capillary: 132 mg/dL — ABNORMAL HIGH (ref 70–99)
Glucose-Capillary: 135 mg/dL — ABNORMAL HIGH (ref 70–99)

## 2024-02-12 MED ORDER — ENSURE PLUS HIGH PROTEIN PO LIQD
237.0000 mL | Freq: Three times a day (TID) | ORAL | Status: DC
  Administered 2024-02-12 – 2024-02-23 (×54): 237 mL via ORAL

## 2024-02-12 MED ORDER — SERTRALINE HCL 100 MG PO TABS
100.0000 mg | ORAL_TABLET | Freq: Every day | ORAL | Status: DC
  Administered 2024-02-13 – 2024-02-14 (×4): 100 mg
  Filled 2024-02-12 (×2): qty 1
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
## 2024-02-12 NOTE — Progress Notes (Signed)
 Pt return to unit from swallow study.
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
## 2024-02-12 NOTE — TOC Progression Note (Signed)
 Transition of Care [facility]) - Progression Note    Patient Details  Name: [name] [name] MRN: [id number] Date of Birth: Sep 25, 1933  [facility]) CM/SW Contact  Junius Faucett E Donathan Buller, LCSW Phone Number: 02/12/2024, 1:17 PM  Clinical Narrative:    [facility] AC Danika confirmed that they can accept patient on nectar thick liquids and that patient can be followed by SLP while at [facility] if needed.  Barriers are Cortrak, bed availability, and [facility] requests sutures be removed prior to going to [facility] (planned for removal tomorrow per notes).   Expected Discharge Plan: Psychiatric Hospital Barriers to Discharge: Continued Medical Work up               Expected Discharge Plan and Services   Discharge Planning Services: CM Consult   Living arrangements for the past 2 months: Single Family Home                                       Social Drivers of Health (SDOH) Interventions SDOH Screenings   Housing: Low Risk  (01/31/2024)  Transportation Needs: No Transportation Needs (01/31/2024)  Utilities: Not At Risk (01/31/2024)  Social Connections: Moderately Isolated (01/31/2024)  Tobacco Use: Low Risk  (02/01/2024)    Readmission Risk Interventions     No data to display
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
## 2024-02-12 NOTE — Progress Notes (Signed)
 Pt off unit for swallow study with safety sitter.
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
## 2024-02-12 NOTE — TOC Progression Note (Addendum)
 Transition of Care [facility]) - Progression Note    Patient Details  Name: EZEQUIAS LARD MRN: [id number] Date of Birth: 1933-06-10  Transition of Care [facility] CM/SW Contact  Gracelynne Benedict E Tamika Shropshire, LCSW Phone Number: 02/12/2024, 9:27 AM  Clinical Narrative:    Cherylynn [facility] AC is checking to confirm diet requirements at [facility]. Per [facility] staff - We could accommodate a puree diet or liquid diet. It is important we ensure we are focusing on his care and outcomes with that as we are an IP BH unit and not a medpsych. He would need to be cleared and at a point where the cortrack is not needed or considered further for sure as that is not something the [place] unit can support. Notes show he is set to have sutures removed 9/10. It would be best for those to be removed prior to his arrival. Updated care team.  Expected Dis[facility] Plan: [facility] Barriers to Discharge: Continued Medical Work up               Expected Discharge Plan and Services   Discharge Planning Services: CM Consult   Living arrangements for the past 2 months: Single Family Home                                       Social Drivers of Health (SDOH) Interventions SDOH Screenings   Housing: Low Risk  (01/31/2024)  Transportation Needs: No Transportation Needs (01/31/2024)  Utilities: Not At Risk (01/31/2024)  Social Connections: Moderately Isolated (01/31/2024)  Tobacco Use: Low Risk  (02/01/2024)    Readmission Risk Interventions     No data to display
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
## 2024-02-13 LAB — GLUCOSE, CAPILLARY
Glucose-Capillary: 103 mg/dL — ABNORMAL HIGH (ref 70–99)
Glucose-Capillary: 111 mg/dL — ABNORMAL HIGH (ref 70–99)
Glucose-Capillary: 120 mg/dL — ABNORMAL HIGH (ref 70–99)
Glucose-Capillary: 120 mg/dL — ABNORMAL HIGH (ref 70–99)
Glucose-Capillary: 134 mg/dL — ABNORMAL HIGH (ref 70–99)
Glucose-Capillary: 141 mg/dL — ABNORMAL HIGH (ref 70–99)
Glucose-Capillary: 155 mg/dL — ABNORMAL HIGH (ref 70–99)

## 2024-02-13 MED ORDER — POLYETHYLENE GLYCOL 3350 17 G PO PACK
17.0000 g | PACK | Freq: Every day | ORAL | Status: DC
  Administered 2024-02-14 (×2): 17 g
  Filled 2024-02-13: qty 1

## 2024-02-13 MED ORDER — BANATROL TF EN LIQD
60.0000 mL | Freq: Two times a day (BID) | ENTERAL | Status: DC
  Administered 2024-02-13 – 2024-02-20 (×28): 60 mL
  Filled 2024-02-13 (×15): qty 60

## 2024-02-13 MED ORDER — OSMOLITE 1.5 CAL PO LIQD
1000.0000 mL | ORAL | Status: DC
  Administered 2024-02-13 – 2024-02-19 (×14): 1000 mL
  Filled 2024-02-13 (×2): qty 1000

## 2024-02-13 MED ORDER — AMOXICILLIN-POT CLAVULANATE 875-125 MG PO TABS
1.0000 | ORAL_TABLET | Freq: Two times a day (BID) | ORAL | Status: DC
  Administered 2024-02-13 – 2024-02-14 (×8): 1
  Filled 2024-02-13 (×4): qty 1

## 2024-02-13 MED ORDER — BETHANECHOL CHLORIDE 10 MG PO TABS
10.0000 mg | ORAL_TABLET | Freq: Three times a day (TID) | ORAL | Status: DC
  Administered 2024-02-13 (×4): 10 mg
  Filled 2024-02-13 (×2): qty 1
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
## 2024-02-13 NOTE — Plan of Care (Signed)

## 2024-02-14 DIAGNOSIS — F332 Major depressive disorder, recurrent severe without psychotic features: Secondary | ICD-10-CM | POA: Diagnosis not present

## 2024-02-14 DIAGNOSIS — W3400XA Accidental discharge from unspecified firearms or gun, initial encounter: Secondary | ICD-10-CM | POA: Diagnosis not present

## 2024-02-14 DIAGNOSIS — R45851 Suicidal ideations: Secondary | ICD-10-CM | POA: Diagnosis not present

## 2024-02-14 LAB — GLUCOSE, CAPILLARY
Glucose-Capillary: 128 mg/dL — ABNORMAL HIGH (ref 70–99)
Glucose-Capillary: 121 mg/dL — ABNORMAL HIGH (ref 70–99)
Glucose-Capillary: 131 mg/dL — ABNORMAL HIGH (ref 70–99)
Glucose-Capillary: 116 mg/dL — ABNORMAL HIGH (ref 70–99)

## 2024-02-14 MED ORDER — BETHANECHOL CHLORIDE 10 MG PO TABS
5.0000 mg | ORAL_TABLET | Freq: Three times a day (TID) | ORAL | Status: DC
  Administered 2024-02-15 – 2024-02-19 (×26): 5 mg via ORAL
  Filled 2024-02-14 (×13): qty 1

## 2024-02-14 MED ORDER — ACETAMINOPHEN 325 MG PO TABS
650.0000 mg | ORAL_TABLET | Freq: Four times a day (QID) | ORAL | Status: DC
  Administered 2024-02-15 – 2024-02-23 (×62): 650 mg via ORAL
  Filled 2024-02-14 (×31): qty 2

## 2024-02-14 MED ORDER — LATANOPROST 0.005 % OP SOLN
1.0000 [drp] | Freq: Every day | OPHTHALMIC | Status: DC
  Administered 2024-02-14 – 2024-02-22 (×18): 1 [drp] via OPHTHALMIC
  Filled 2024-02-14: qty 2.5

## 2024-02-14 MED ORDER — POLYVINYL ALCOHOL 1.4 % OP SOLN
2.0000 [drp] | OPHTHALMIC | Status: DC | PRN
  Filled 2024-02-14: qty 15

## 2024-02-14 MED ORDER — RAMELTEON 8 MG PO TABS
8.0000 mg | ORAL_TABLET | Freq: Every day | ORAL | Status: DC
  Administered 2024-02-15 – 2024-02-22 (×16): 8 mg via ORAL
  Filled 2024-02-14 (×9): qty 1

## 2024-02-14 MED ORDER — AMOXICILLIN-POT CLAVULANATE 875-125 MG PO TABS
1.0000 | ORAL_TABLET | Freq: Two times a day (BID) | ORAL | Status: AC
  Administered 2024-02-15 – 2024-02-19 (×20): 1 via ORAL
  Filled 2024-02-14 (×10): qty 1

## 2024-02-14 MED ORDER — POLYETHYLENE GLYCOL 3350 17 G PO PACK
17.0000 g | PACK | Freq: Every day | ORAL | Status: DC
  Administered 2024-02-15 (×2): 17 g via ORAL
  Filled 2024-02-14: qty 1

## 2024-02-14 MED ORDER — DOXAZOSIN MESYLATE 1 MG PO TABS
1.0000 mg | ORAL_TABLET | Freq: Every day | ORAL | Status: DC
  Administered 2024-02-15 – 2024-02-23 (×18): 1 mg via ORAL
  Filled 2024-02-14 (×9): qty 1

## 2024-02-14 MED ORDER — OXYCODONE HCL 5 MG PO TABS
2.5000 mg | ORAL_TABLET | ORAL | Status: DC | PRN
  Administered 2024-02-14 (×4): 5 mg
  Filled 2024-02-14 (×2): qty 1

## 2024-02-14 MED ORDER — TIMOLOL MALEATE 0.5 % OP SOLN
1.0000 [drp] | Freq: Every day | OPHTHALMIC | Status: DC
  Administered 2024-02-14 – 2024-02-23 (×20): 1 [drp] via OPHTHALMIC
  Filled 2024-02-14: qty 5

## 2024-02-14 MED ORDER — GUAIFENESIN 100 MG/5ML PO LIQD
5.0000 mL | ORAL | Status: DC | PRN
  Administered 2024-02-15 – 2024-02-17 (×6): 5 mL via ORAL
  Filled 2024-02-14 (×3): qty 10

## 2024-02-14 MED ORDER — BETHANECHOL CHLORIDE 10 MG PO TABS
5.0000 mg | ORAL_TABLET | Freq: Three times a day (TID) | ORAL | Status: DC
  Administered 2024-02-14 (×6): 5 mg
  Filled 2024-02-14 (×3): qty 1

## 2024-02-14 MED ORDER — ADULT MULTIVITAMIN W/MINERALS CH
1.0000 | ORAL_TABLET | Freq: Every day | ORAL | Status: DC
  Administered 2024-02-15 – 2024-02-23 (×18): 1 via ORAL
  Filled 2024-02-14 (×9): qty 1

## 2024-02-14 MED ORDER — DIPHENHYDRAMINE HCL 12.5 MG/5ML PO ELIX
12.5000 mg | ORAL_SOLUTION | Freq: Every evening | ORAL | Status: DC | PRN
  Administered 2024-02-15 – 2024-02-18 (×6): 12.5 mg via ORAL
  Filled 2024-02-14 (×4): qty 5

## 2024-02-14 MED ORDER — OXYCODONE HCL 5 MG PO TABS
2.5000 mg | ORAL_TABLET | ORAL | Status: DC | PRN
  Administered 2024-02-15 – 2024-02-23 (×58): 5 mg via ORAL
  Filled 2024-02-14 (×29): qty 1

## 2024-02-14 MED ORDER — DOCUSATE SODIUM 100 MG PO CAPS
100.0000 mg | ORAL_CAPSULE | Freq: Two times a day (BID) | ORAL | Status: DC
  Administered 2024-02-15 (×4): 100 mg via ORAL
  Filled 2024-02-14 (×5): qty 1

## 2024-02-14 MED ORDER — SERTRALINE HCL 100 MG PO TABS
100.0000 mg | ORAL_TABLET | Freq: Every day | ORAL | Status: DC
  Administered 2024-02-15 – 2024-02-23 (×18): 100 mg via ORAL
  Filled 2024-02-14 (×9): qty 1
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
## 2024-02-14 NOTE — TOC Progression Note (Signed)
 Transition of Care [facility]) - Progression Note    Patient Details  Name: [name] [name] MRN: [id number] Date of Birth: 04/24/1934  [facility] of [facility] CM/SW Contact  Khalin Royce E Sayvon Arterberry, LCSW Phone Number: 02/14/2024, 3:25 PM  Clinical Narrative:    ICM continues to follow. Plan to transition to [facility] Psych when appropriate. Patient currently receiving nocturnal feeds via Cortrak.   Expected Discharge Plan: Psychiatric Hospital Barriers to Discharge: Continued Medical Work up               Expected Discharge Plan and Services   Discharge Planning Services: CM Consult   Living arrangements for the past 2 months: Single Family Home                                       Social Drivers of Health (SDOH) Interventions SDOH Screenings   Housing: Low Risk  (01/31/2024)  Transportation Needs: No Transportation Needs (01/31/2024)  Utilities: Not At Risk (01/31/2024)  Social Connections: Moderately Isolated (01/31/2024)  Tobacco Use: Low Risk  (02/01/2024)    Readmission Risk Interventions     No data to display
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
## 2024-02-14 NOTE — Progress Notes (Signed)
 Calorie Count Note  Pt only had 2 Mighty shakes and 50% of an Ensure yesterday. Nurse tech reports she was unable to get him to drink anything else besides these 3 items during her shift. Unfortunately evening sitter did not document any intake for pt. Spoke to current sitter at bedside to offer pt something to drink every 1-3 hours and document all intake. Pt still not yet ready to have cortrak tube removed.  48 hour calorie count ordered.  Diet: FLD, nectar thick liquids only, no pudding, grits, etc per speech  Supplements: Ensure High Protein, Mighty Shake   Day 1 9/10: Breakfast: 100% Mighty shake, 50% Ensure High protein (500 kcal, 19 gm protein) Lunch: 100% Mighty shake (330 kcal, 8 gm protein) Dinner: Nothing documented No other thickened liquids documented  Total intake: 800 kcal (44% of minimum estimated needs)  27 gm protein (27% of minimum estimated needs)  Day 2 9/11: Breakfast: 100% Thickened milk and 100% Mighty shake   Estimated Nutritional Needs:  Kcal:  1800-2000 Protein:  100-125 grams Fluid:  >1.8 L/day  INTERVENTION:  Transition to nocturnal tube feeding via Cortrak x 12 hours: Osmolite 1.5 at 83 ml/hr from 1800-0600 (996 ml per day)   Provides 1494 kcal, 62 gm protein, 759 ml free water daily   Encourage PO intake - Nectar thick liquids only. Per speech no puddings, grits, etc.  Nursing to assist with all meals, full supervision Ensure Plus High Protein po TID, each supplement provides 350 kcal and 20 grams of protein *Nursing to thicken to correct consistency  Mighty Shake TID, each supplement provides 330 kcals and 9 grams of protein Banatrol BID per tube-provides 45kcal, 5g soluble fiber and 2g protein per serving. MVI with minerals daily  Monitor for diet advancement  48 hour calorie count   NUTRITION DIAGNOSIS:    Increased nutrient needs related to acute illness as evidenced by estimated needs. - Ongoing    GOAL:    Patient will meet greater  than or equal to 90% of their needs - Meeting via TF  Olivia Kenning, RD Registered Dietitian  See Amion for more information
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
## 2024-02-15 LAB — GLUCOSE, CAPILLARY
Glucose-Capillary: 123 mg/dL — ABNORMAL HIGH (ref 70–99)
Glucose-Capillary: 108 mg/dL — ABNORMAL HIGH (ref 70–99)
Glucose-Capillary: 171 mg/dL — ABNORMAL HIGH (ref 70–99)
Glucose-Capillary: 96 mg/dL (ref 70–99)
Glucose-Capillary: 127 mg/dL — ABNORMAL HIGH (ref 70–99)
Glucose-Capillary: 121 mg/dL — ABNORMAL HIGH (ref 70–99)

## 2024-02-15 MED ORDER — BOOST / RESOURCE BREEZE PO LIQD CUSTOM: 1.0000 | ORAL | Status: DC | PRN

## 2024-02-15 MED ORDER — POLYETHYLENE GLYCOL 3350 17 G PO PACK: 17.0000 g | PACK | Freq: Every day | ORAL | Status: DC | PRN

## 2024-02-15 NOTE — Progress Notes (Signed)
 Calorie Count Note  Pt had 100% Thickened milk, 1 Mighty shake and 2 Ensures yesterday.  Pt meeting around [phone] kcal and 41 gm protein average between 2 days. Intake seems to be increasing but still not eating enough to have cortrak removed quite yet. Also pt's current diet is not sustainable long term as he is not able to have a variety of full liquids as he is not allowed to have pudding, grits etc. Does not like many thickened liquids but is drinking the Mighty shakes and Ensures however likely pt may experience food fatigue if on current diet long term. Recommend keeping pt on tube feeds until diet advanced. Pt can feed himself.   48 hour calorie count ordered.   Diet: FLD, nectar thick liquids only, no pudding, grits, etc per speech  Supplements: Ensure High Protein, Mighty Shake    Day 1 9/10: Breakfast: 100% Mighty shake, 50% Ensure High protein (500 kcal, 19 gm protein) Lunch: 100% Mighty shake (330 kcal, 8 gm protein) Dinner: Nothing documented No other thickened liquids documented   Total intake: 800 kcal (44% of minimum estimated needs)  27 gm protein (27% of minimum estimated needs)   Day 2 9/11: Breakfast: 100% Thickened milk and 100% Mighty shake (370 kcal, 15 gm protein) Lunch: 100% Ensure (350 kcal, 20 gm protein) Dinner:  100% Ensure (350 kcal, 20 gm protein) No other thickened liquids documented  Total intake: 1070 kcal (59% of minimum estimated needs)  55  gm protein (55% of minimum estimated needs)   Estimated Nutritional Needs:  Kcal:  1800-2000 Protein:  100-125 grams Fluid:  >1.8 L/day   INTERVENTION:  Transition to nocturnal tube feeding via Cortrak x 12 hours: Osmolite 1.5 at 83 ml/hr from 1800-0600 (996 ml per day)   Provides 1494 kcal, 62 gm protein, 759 ml free water daily   Encourage PO intake - Nectar thick liquids only. Per speech no puddings, grits, etc.  Ensure Plus High Protein po TID, each supplement provides 350 kcal and 20 grams of  protein *Nursing to thicken to correct consistency  Mighty Shake TID, each supplement provides 330 kcals and 9 grams of protein Banatrol BID per tube-provides 45kcal, 5g soluble fiber and 2g protein per serving. MVI with minerals daily  Monitor for diet advancement    NUTRITION DIAGNOSIS:    Increased nutrient needs related to acute illness as evidenced by estimated needs. - Ongoing    GOAL:    Patient will meet greater than or equal to 90% of their needs - Meeting via TF   Olivia Kenning, RD Registered Dietitian  See Amion for more information
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
## 2024-02-16 LAB — GLUCOSE, CAPILLARY
Glucose-Capillary: 113 mg/dL — ABNORMAL HIGH (ref 70–99)
Glucose-Capillary: 116 mg/dL — ABNORMAL HIGH (ref 70–99)
Glucose-Capillary: 136 mg/dL — ABNORMAL HIGH (ref 70–99)
Glucose-Capillary: 109 mg/dL — ABNORMAL HIGH (ref 70–99)
Glucose-Capillary: 133 mg/dL — ABNORMAL HIGH (ref 70–99)

## 2024-02-16 MED ORDER — LOPERAMIDE HCL 2 MG PO CAPS
2.0000 mg | ORAL_CAPSULE | ORAL | Status: DC | PRN
  Administered 2024-02-16 – 2024-02-17 (×4): 2 mg via ORAL
  Filled 2024-02-16 (×2): qty 1
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
## 2024-02-16 NOTE — Plan of Care (Signed)
  Problem: Education: Goal: Knowledge of General Education information will improve Description: Including pain rating scale, medication(s)/side effects and non-pharmacologic comfort measures 02/16/2024 0137 by Marvis Kenneth SAILOR, RN Outcome: Progressing 02/15/2024 2229 by Marvis Kenneth SAILOR, RN Outcome: Progressing   Problem: Health Behavior/Discharge Planning: Goal: Ability to manage health-related needs will improve 02/16/2024 0137 by Marvis Kenneth SAILOR, RN Outcome: Progressing 02/15/2024 2229 by Marvis Kenneth SAILOR, RN Outcome: Progressing   Problem: Clinical Measurements: Goal: Ability to maintain clinical measurements within normal limits will improve 02/16/2024 0137 by Marvis Kenneth SAILOR, RN Outcome: Progressing 02/15/2024 2229 by Marvis Kenneth SAILOR, RN Outcome: Progressing Goal: Will remain free from infection 02/16/2024 0137 by Marvis Kenneth SAILOR, RN Outcome: Progressing 02/15/2024 2229 by Marvis Kenneth SAILOR, RN Outcome: Progressing Goal: Diagnostic test results will improve 02/16/2024 0137 by Marvis Kenneth SAILOR, RN Outcome: Progressing 02/15/2024 2229 by Marvis Kenneth SAILOR, RN Outcome: Progressing Goal: Respiratory complications will improve 02/16/2024 0137 by Marvis Kenneth SAILOR, RN Outcome: Progressing 02/15/2024 2229 by Marvis Kenneth SAILOR, RN Outcome: Progressing Goal: Cardiovascular complication will be avoided 02/16/2024 0137 by Marvis Kenneth SAILOR, RN Outcome: Progressing 02/15/2024 2229 by Marvis Kenneth SAILOR, RN Outcome: Progressing   Problem: Activity: Goal: Risk for activity intolerance will decrease 02/16/2024 0137 by Marvis Kenneth SAILOR, RN Outcome: Progressing 02/15/2024 2229 by Marvis Kenneth SAILOR, RN Outcome: Progressing   Problem: Nutrition: Goal: Adequate nutrition will be maintained 02/16/2024 0137 by Marvis Kenneth SAILOR, RN Outcome: Progressing 02/15/2024 2229 by Marvis Kenneth SAILOR, RN Outcome: Progressing   Problem: Coping: Goal: Level of anxiety will  decrease 02/16/2024 0137 by Marvis Kenneth SAILOR, RN Outcome: Progressing 02/15/2024 2229 by Marvis Kenneth SAILOR, RN Outcome: Progressing   Problem: Elimination: Goal: Will not experience complications related to bowel motility 02/16/2024 0137 by Marvis Kenneth SAILOR, RN Outcome: Progressing 02/15/2024 2229 by Marvis Kenneth SAILOR, RN Outcome: Progressing Goal: Will not experience complications related to urinary retention 02/16/2024 0137 by Marvis Kenneth SAILOR, RN Outcome: Progressing 02/15/2024 2229 by Marvis Kenneth SAILOR, RN Outcome: Progressing   Problem: Pain Managment: Goal: General experience of comfort will improve and/or be controlled 02/16/2024 0137 by Marvis Kenneth SAILOR, RN Outcome: Progressing 02/15/2024 2229 by Marvis Kenneth SAILOR, RN Outcome: Progressing   Problem: Safety: Goal: Ability to remain free from injury will improve 02/16/2024 0137 by Marvis Kenneth SAILOR, RN Outcome: Progressing 02/15/2024 2229 by Marvis Kenneth SAILOR, RN Outcome: Progressing   Problem: Skin Integrity: Goal: Risk for impaired skin integrity will decrease 02/16/2024 0137 by Marvis Kenneth SAILOR, RN Outcome: Progressing 02/15/2024 2229 by Marvis Kenneth SAILOR, RN Outcome: Progressing

## 2024-02-17 DIAGNOSIS — R45851 Suicidal ideations: Secondary | ICD-10-CM | POA: Diagnosis not present

## 2024-02-17 DIAGNOSIS — F329 Major depressive disorder, single episode, unspecified: Secondary | ICD-10-CM | POA: Diagnosis not present

## 2024-02-17 LAB — GLUCOSE, CAPILLARY
Glucose-Capillary: 125 mg/dL — ABNORMAL HIGH (ref 70–99)
Glucose-Capillary: 131 mg/dL — ABNORMAL HIGH (ref 70–99)
Glucose-Capillary: 137 mg/dL — ABNORMAL HIGH (ref 70–99)
Glucose-Capillary: 144 mg/dL — ABNORMAL HIGH (ref 70–99)
Glucose-Capillary: 146 mg/dL — ABNORMAL HIGH (ref 70–99)
Glucose-Capillary: 153 mg/dL — ABNORMAL HIGH (ref 70–99)
Glucose-Capillary: 94 mg/dL (ref 70–99)

## 2024-02-17 MED ORDER — LOPERAMIDE HCL 2 MG PO CAPS
2.0000 mg | ORAL_CAPSULE | Freq: Two times a day (BID) | ORAL | Status: DC
  Administered 2024-02-17 – 2024-02-23 (×26): 2 mg via ORAL
  Filled 2024-02-17 (×13): qty 1
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
## 2024-02-17 NOTE — Plan of Care (Signed)
  Problem: Education: Goal: Knowledge of General Education information will improve Description: Including pain rating scale, medication(s)/side effects and non-pharmacologic comfort measures 02/17/2024 0225 by Marvis Kenneth SAILOR, RN Outcome: Progressing 02/16/2024 2031 by Marvis Kenneth SAILOR, RN Outcome: Progressing   Problem: Health Behavior/Discharge Planning: Goal: Ability to manage health-related needs will improve 02/17/2024 0225 by Marvis Kenneth SAILOR, RN Outcome: Progressing 02/16/2024 2031 by Marvis Kenneth SAILOR, RN Outcome: Progressing   Problem: Clinical Measurements: Goal: Ability to maintain clinical measurements within normal limits will improve 02/17/2024 0225 by Marvis Kenneth SAILOR, RN Outcome: Progressing 02/16/2024 2031 by Marvis Kenneth SAILOR, RN Outcome: Progressing Goal: Will remain free from infection 02/17/2024 0225 by Marvis Kenneth SAILOR, RN Outcome: Progressing 02/16/2024 2031 by Marvis Kenneth SAILOR, RN Outcome: Progressing Goal: Diagnostic test results will improve 02/17/2024 0225 by Marvis Kenneth SAILOR, RN Outcome: Progressing 02/16/2024 2031 by Marvis Kenneth SAILOR, RN Outcome: Progressing Goal: Respiratory complications will improve 02/17/2024 0225 by Marvis Kenneth SAILOR, RN Outcome: Progressing 02/16/2024 2031 by Marvis Kenneth SAILOR, RN Outcome: Progressing Goal: Cardiovascular complication will be avoided 02/17/2024 0225 by Marvis Kenneth SAILOR, RN Outcome: Progressing 02/16/2024 2031 by Marvis Kenneth SAILOR, RN Outcome: Progressing   Problem: Activity: Goal: Risk for activity intolerance will decrease 02/17/2024 0225 by Marvis Kenneth SAILOR, RN Outcome: Progressing 02/16/2024 2031 by Marvis Kenneth SAILOR, RN Outcome: Progressing   Problem: Nutrition: Goal: Adequate nutrition will be maintained 02/17/2024 0225 by Marvis Kenneth SAILOR, RN Outcome: Progressing 02/16/2024 2031 by Marvis Kenneth SAILOR, RN Outcome: Progressing   Problem: Coping: Goal: Level of anxiety will  decrease 02/17/2024 0225 by Marvis Kenneth SAILOR, RN Outcome: Progressing 02/16/2024 2031 by Marvis Kenneth SAILOR, RN Outcome: Progressing   Problem: Elimination: Goal: Will not experience complications related to bowel motility 02/17/2024 0225 by Marvis Kenneth SAILOR, RN Outcome: Progressing 02/16/2024 2031 by Marvis Kenneth SAILOR, RN Outcome: Progressing Goal: Will not experience complications related to urinary retention 02/17/2024 0225 by Marvis Kenneth SAILOR, RN Outcome: Progressing 02/16/2024 2031 by Marvis Kenneth SAILOR, RN Outcome: Progressing   Problem: Pain Managment: Goal: General experience of comfort will improve and/or be controlled 02/17/2024 0225 by Marvis Kenneth SAILOR, RN Outcome: Progressing 02/16/2024 2031 by Marvis Kenneth SAILOR, RN Outcome: Progressing   Problem: Safety: Goal: Ability to remain free from injury will improve 02/17/2024 0225 by Marvis Kenneth SAILOR, RN Outcome: Progressing 02/16/2024 2031 by Marvis Kenneth SAILOR, RN Outcome: Progressing   Problem: Skin Integrity: Goal: Risk for impaired skin integrity will decrease 02/17/2024 0225 by Marvis Kenneth SAILOR, RN Outcome: Progressing 02/16/2024 2031 by Marvis Kenneth SAILOR, RN Outcome: Progressing

## 2024-02-18 LAB — GLUCOSE, CAPILLARY
Glucose-Capillary: 111 mg/dL — ABNORMAL HIGH (ref 70–99)
Glucose-Capillary: 114 mg/dL — ABNORMAL HIGH (ref 70–99)
Glucose-Capillary: 120 mg/dL — ABNORMAL HIGH (ref 70–99)
Glucose-Capillary: 124 mg/dL — ABNORMAL HIGH (ref 70–99)
Glucose-Capillary: 134 mg/dL — ABNORMAL HIGH (ref 70–99)
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
## 2024-02-19 ENCOUNTER — Inpatient Hospital Stay (HOSPITAL_COMMUNITY)

## 2024-02-19 LAB — GLUCOSE, CAPILLARY
Glucose-Capillary: 104 mg/dL — ABNORMAL HIGH (ref 70–99)
Glucose-Capillary: 113 mg/dL — ABNORMAL HIGH (ref 70–99)
Glucose-Capillary: 117 mg/dL — ABNORMAL HIGH (ref 70–99)
Glucose-Capillary: 124 mg/dL — ABNORMAL HIGH (ref 70–99)
Glucose-Capillary: 134 mg/dL — ABNORMAL HIGH (ref 70–99)
Glucose-Capillary: 136 mg/dL — ABNORMAL HIGH (ref 70–99)

## 2024-02-19 MED ORDER — INFLUENZA VAC SPLIT HIGH-DOSE 0.5 ML IM SUSY
0.5000 mL | PREFILLED_SYRINGE | Freq: Once | INTRAMUSCULAR | Status: AC
Start: 2024-02-22 — End: 2024-02-22
  Administered 2024-02-22 (×2): 0.5 mL via INTRAMUSCULAR
  Filled 2024-02-19: qty 0.5

## 2024-02-19 NOTE — TOC Progression Note (Signed)
 Transition of Care [facility]) - Progression Note    Patient Details  Name: [name] [name] MRN: [id number] Date of Birth: Mar 26, 1934  [facility] CM/SW Contact  Sha Burling E Cyril Railey, LCSW Phone Number: 02/19/2024, 3:10 PM  Clinical Narrative:    Spoke with son [place] by phone. He confirms they prefer [facility] for [employer] [place] states patient and family are agreeable to plan for [facility] pending Cortrak removal and bed availability. [place] states he has been updating patient regarding this and explaining this plan to patient as well. [place] states he would like to see if someone could talk to patient for therapeutic support to reflect on his feelings until he is able to go to [facility]. He states he thinks talking to a Chaplain would be beneficial. CSW asked PA for Chaplain consult.   Expected Discharge Plan: Psychiatric Hospital Barriers to Discharge: Continued Medical Work up               Expected Discharge Plan and Services   Discharge Planning Services: CM Consult   Living arrangements for the past 2 months: Single Family Home                                       Social Drivers of Health (SDOH) Interventions SDOH Screenings   Housing: Low Risk  (01/31/2024)  Transportation Needs: No Transportation Needs (01/31/2024)  Utilities: Not At Risk (01/31/2024)  Social Connections: Moderately Isolated (01/31/2024)  Tobacco Use: Low Risk  (02/01/2024)    Readmission Risk Interventions     No data to display

## 2024-02-19 NOTE — Plan of Care (Signed)
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
## 2024-02-19 NOTE — TOC Progression Note (Addendum)
 Transition of Care [facility]) - Progression Note    Patient Details  Name: [name] [name] MRN: [id number] Date of Birth: 07-29-1933  [facility] CM/SW Contact  Dominika Losey E Pailynn Vahey, LCSW Phone Number: 02/19/2024, 9:26 AM  Clinical Narrative:    Reached out to [facility] with [facility]. Informed her that the goal is to remove Cortrak later this week. Asked for confirmation that they will still be able to accept patient once Cortrak is out. Danika states they should be able to, she will staff the case with her NM today. Cherylynn states currently [facility] is at capacity so would have to wait for a bed. Danika advised to reach out to [facility] daily once Cortrak is out to check bed availability.  CSW will send out to other Geri Psych if no bed at [facility] once patient is medically stable for IP Psych (Cortrak is out).    Expected Discharge Plan: Psychiatric Hospital Barriers to Discharge: Continued Medical Work up               Expected Discharge Plan and Services   Discharge Planning Services: CM Consult   Living arrangements for the past 2 months: Single Family Home                                       Social Drivers of Health (SDOH) Interventions SDOH Screenings   Housing: Low Risk  (01/31/2024)  Transportation Needs: No Transportation Needs (01/31/2024)  Utilities: Not At Risk (01/31/2024)  Social Connections: Moderately Isolated (01/31/2024)  Tobacco Use: Low Risk  (02/01/2024)    Readmission Risk Interventions     No data to display

## 2024-02-19 NOTE — Progress Notes (Addendum)
 Calorie Count Note  Pt meeting under 50% of nutritional needs by PO. Goal for pt to meet at least 60% of needs by po to removed cortrak tube. Pt only seems to like strawberry flavors and Strawberry Nepro has been out of stock on the floor, asked unit secretary to order more.   Pt's current diet is not sustainable long term as he is not able to have a variety of full liquids as he is not allowed to have pudding, grits etc. Likely pt may experience food fatigue if on current diet long term. Recommend keeping pt on tube feeds until diet advanced. Pt can feed himself.   48 hour calorie count ordered.  Diet: FLD, Nectar thick liquids only, no pudding, grits, etc. Supplements: Magic cups, Mighty shakes, Ensure high protein, Nepro  DAY 1: 9/15 Breakfast: 50% Nepro, 50% apple juice,  280 kcal, 10 gm protein) Lunch: 50% Nepro, 100% Thickened milk (380 kcal, 20 gm protein) Dinner: 50% Ensure (175 kcal, 10 gm protein)  Total intake: 835 kcal (46% of minimum estimated needs)  40 gm protein (40% of minimum estimated needs)  Day 2: 9/16 Breakfast: 2 Thickened milk  Estimated Nutritional Needs:  Kcal:  1800-2000 Protein:  100-125 grams Fluid:  >1.8 L/day   INTERVENTION:  Continue nocturnal tube feeding via Cortrak x 12 hours: Osmolite 1.5 at 83 ml/hr from 1800-0600 (996 ml per day)   Provides 1494 kcal, 62 gm protein, 759 ml free water daily   Encourage PO intake - Nectar thick liquids only. Per speech no puddings, grits, etc.  Ensure Plus High Protein po TID, each supplement provides 350 kcal and 20 grams of protein *Nursing to thicken to correct consistency  Mighty Shake TID, each supplement provides 330 kcals and 9 grams of protein Banatrol BID per tube-provides 45kcal, 5g soluble fiber and 2g protein per serving. MVI with minerals daily  Monitor for diet advancement    NUTRITION DIAGNOSIS:    Increased nutrient needs related to acute illness as evidenced by estimated needs. - Ongoing     GOAL:    Patient will meet greater than or equal to 90% of their needs - Meeting via TF   Olivia Kenning, RD Registered Dietitian  See Amion for more information
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
## 2024-02-20 LAB — GLUCOSE, CAPILLARY
Glucose-Capillary: 149 mg/dL — ABNORMAL HIGH (ref 70–99)
Glucose-Capillary: 109 mg/dL — ABNORMAL HIGH (ref 70–99)
Glucose-Capillary: 121 mg/dL — ABNORMAL HIGH (ref 70–99)
Glucose-Capillary: 126 mg/dL — ABNORMAL HIGH (ref 70–99)
Glucose-Capillary: 107 mg/dL — ABNORMAL HIGH (ref 70–99)
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
## 2024-02-20 NOTE — Plan of Care (Signed)
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
## 2024-02-21 ENCOUNTER — Encounter (HOSPITAL_COMMUNITY)
Admission: EM | Disposition: A | Discharge status: Other Acute Inpatient Hospital | Payer: Self-pay | Source: Home / Self Care

## 2024-02-21 LAB — BASIC METABOLIC PANEL WITH GFR
Anion gap: 11 (ref 5–15)
BUN: 27 mg/dL — ABNORMAL HIGH (ref 8–23)
CO2: 30 mmol/L (ref 22–32)
Calcium: 10.1 mg/dL (ref 8.9–10.3)
Chloride: 96 mmol/L — ABNORMAL LOW (ref 98–111)
Creatinine, Ser: 0.91 mg/dL (ref 0.61–1.24)
GFR, Estimated: >60 mL/min (ref >60)
Glucose, Bld: 109 mg/dL — ABNORMAL HIGH (ref 70–99)
Potassium: 4.8 mmol/L (ref 3.5–5.1)
Sodium: 137 mmol/L (ref 135–145)

## 2024-02-21 LAB — CBC
HCT: 34.5 % — ABNORMAL LOW (ref 39.0–52.0)
Hemoglobin: 10.6 g/dL — ABNORMAL LOW (ref 13.0–17.0)
MCH: 29.2 pg (ref 26.0–34.0)
MCHC: 30.7 g/dL (ref 30.0–36.0)
MCV: 95 fL (ref 80.0–100.0)
Platelets: 323 K/uL (ref 150–400)
RBC: 3.63 MIL/uL — ABNORMAL LOW (ref 4.22–5.81)
RDW: 15.3 % (ref 11.5–15.5)
WBC: 6.3 K/uL (ref 4.0–10.5)
nRBC: 0 % (ref 0.0–0.2)

## 2024-02-21 LAB — GLUCOSE, CAPILLARY
Glucose-Capillary: 107 mg/dL — ABNORMAL HIGH (ref 70–99)
Glucose-Capillary: 110 mg/dL — ABNORMAL HIGH (ref 70–99)
Glucose-Capillary: 117 mg/dL — ABNORMAL HIGH (ref 70–99)
Glucose-Capillary: 123 mg/dL — ABNORMAL HIGH (ref 70–99)
Glucose-Capillary: 146 mg/dL — ABNORMAL HIGH (ref 70–99)
Glucose-Capillary: 96 mg/dL (ref 70–99)
Glucose-Capillary: 124 mg/dL — ABNORMAL HIGH (ref 70–99)

## 2024-02-21 LAB — SARS CORONAVIRUS 2 BY RT PCR: SARS Coronavirus 2 by RT PCR: NEGATIVE

## 2024-02-21 SURGERY — REPAIR, ECTROPION, EYELID
Anesthesia: General | Laterality: Left

## 2024-02-21 MED ORDER — ARTIFICIAL TEARS OPHTHALMIC OINT
TOPICAL_OINTMENT | Freq: Every day | OPHTHALMIC | Status: DC
  Filled 2024-02-21: qty 3.5

## 2024-02-21 MED ORDER — POLYVINYL ALCOHOL 1.4 % OP SOLN
2.0000 [drp] | OPHTHALMIC | Status: DC
  Administered 2024-02-21 – 2024-02-23 (×64): 2 [drp] via OPHTHALMIC
  Filled 2024-02-21: qty 15

## 2024-02-21 NOTE — Progress Notes (Signed)
   02/21/24 1314  Spiritual Encounters  Type of Visit Initial  Care provided to: Pt and family  Referral source Clinical staff  Reason for visit Routine spiritual support  OnCall Visit No  Spiritual Framework  Presenting Themes Meaning/purpose/sources of inspiration;Impactful experiences and emotions  Community/Connection Family  Patient Stress Factors Exhausted;Health changes  Family Stress Factors Exhausted;Health changes  Interventions  Spiritual Care Interventions Made Compassionate presence;Established relationship of care and support  Intervention Outcomes  Outcomes Awareness of support;Awareness of health   Chaplain introduced self to Pt and son. Pt was resting comfortably at the time of visit. No spiritual or emotional need were expressed. Chaplain services remain available upon request.
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
## 2024-02-21 NOTE — Progress Notes (Signed)
 Spoke with Dr. Luciano.  OR cancelled for today. He will arrange outpatient surgery. Okay for discharge from his standpoint. Recommends   - Moisture chamber (I have asked OR to locate this and send this up for patient)  - Artificial tears q1hr while awake  - Lacrilube at bedtime  - If any grittiness or concern for corneal abrasions, optho consult Diet re-ordered I have reached out to [facility] to let them know he is medical stable for discharge to [facility] geri-psych   Yehoshua Vitelli M Zachory Mangual , PA-C [facility] 02/21/2024, 1:29 PM Please see Amion for pager number during day hours 7:00am-4:30pm
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
## 2024-02-21 NOTE — TOC Progression Note (Signed)
 Patient ate 100% of dinner tray. Additionally had 100% of a vanilla ice cream and ensure.
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
## 2024-02-21 NOTE — TOC Progression Note (Addendum)
 Transition of Care [facility]) - Progression Note    Patient Details  Name: [name] [name] MRN: [id number] Date of Birth: 1933-10-04  [facility]) CM/SW Contact  Madia Carvell E Filbert Craze, LCSW Phone Number: 02/21/2024, 1:29 PM  Clinical Narrative:    Patient's cortrak is out. Patient medically ready for [facility] Geri Psych. Reached out to [facility] to see if they have beds.  Update 3:30- [facility] Linsey states she is having their Psych MD review to confirm they can take patient. Also awaiting a bed, she is not sure if they will have a bed today/tomorrow or not.  COVID test ordered and DC Summary being pended by PA per [facility] request.   Update 3:55- Met with patient at bedside. Patient signed Voluntary Admission and Consent for Treatment per [facility] request. Form faxed to [facility] and scanned into chart. Patient agreeable to transfer to [facility] Psych pending approval from their MD and bed availability.  Expected Discharge Plan: Psychiatric Hospital Barriers to Discharge: Continued Medical Work up               Expected Discharge Plan and Services   Discharge Planning Services: CM Consult   Living arrangements for the past 2 months: Single Family Home                                       Social Drivers of Health (SDOH) Interventions SDOH Screenings   Housing: Low Risk  (01/31/2024)  Transportation Needs: No Transportation Needs (01/31/2024)  Utilities: Not At Risk (01/31/2024)  Social Connections: Moderately Isolated (01/31/2024)  Tobacco Use: Low Risk  (02/01/2024)    Readmission Risk Interventions     No data to display

## 2024-02-21 NOTE — Progress Notes (Signed)
 SLP Cancellation Note  Patient Details Name: [name] [name] MRN: [id number] DOB: 1934/03/16   Cancelled treatment:       Reason Eval/Treat Not Completed: Medical issues which prohibited therapy (pt NPO this am pending OR with ENT). Will f/u as able.   Leita SAILOR., M.A. CCC-SLP Acute Rehabilitation Services Office: [phone]  Secure chat preferred  02/21/2024, 10:22 AM

## 2024-02-22 LAB — GLUCOSE, CAPILLARY
Glucose-Capillary: 100 mg/dL — ABNORMAL HIGH (ref 70–99)
Glucose-Capillary: 120 mg/dL — ABNORMAL HIGH (ref 70–99)
Glucose-Capillary: 130 mg/dL — ABNORMAL HIGH (ref 70–99)
Glucose-Capillary: 98 mg/dL (ref 70–99)
Glucose-Capillary: 108 mg/dL — ABNORMAL HIGH (ref 70–99)
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
## 2024-02-22 NOTE — Progress Notes (Addendum)
 FACIAL PLASTIC SURGERY UPDATE NOTE  Scheduled patient for left lower eyelid ectropion repair, left upper eyelid platinum weight insertion at [facility] 02/27/24.   Continue eye care to reduce exposure keratopathy/corneal abrasion (Eye drops, lacrilube, moisture chamber).  Electronically signed by:  Elspeth Coddington, MD  Facial Plastic & Reconstructive Surgery Otolaryngology - Head and Neck Surgery [facility]    Patient evaluated a bedside. Notified him of surgery for next Wednesday, ectropion repair and upper eyelid weight.   Physical exam there was gauze sitting directly to top the patient's eye/contacting the cornea with an ice shield a top of this. I remove this.   Please do not place gauze directly a top the patient's eye/cornea. This will lead to corneal abrasion. Nursing care order placed.    Elspeth Coddington MD

## 2024-02-22 NOTE — Plan of Care (Signed)
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
## 2024-02-23 ENCOUNTER — Inpatient Hospital Stay
Admission: AD | Admit: 2024-02-23 | Discharge: 2024-02-27 | DRG: 885 | Disposition: A | Discharge status: Home / Self Care | Source: Intra-hospital | Attending: Psychiatry | Admitting: Psychiatry

## 2024-02-23 ENCOUNTER — Encounter: Payer: Self-pay | Admitting: Psychiatry

## 2024-02-23 ENCOUNTER — Other Ambulatory Visit: Payer: Self-pay

## 2024-02-23 DIAGNOSIS — Z8546 Personal history of malignant neoplasm of prostate: Secondary | ICD-10-CM

## 2024-02-23 DIAGNOSIS — F419 Anxiety disorder, unspecified: Secondary | ICD-10-CM | POA: Diagnosis present

## 2024-02-23 DIAGNOSIS — E785 Hyperlipidemia, unspecified: Secondary | ICD-10-CM | POA: Diagnosis present

## 2024-02-23 DIAGNOSIS — N400 Enlarged prostate without lower urinary tract symptoms: Secondary | ICD-10-CM | POA: Diagnosis present

## 2024-02-23 DIAGNOSIS — I10 Essential (primary) hypertension: Secondary | ICD-10-CM | POA: Diagnosis present

## 2024-02-23 DIAGNOSIS — F332 Major depressive disorder, recurrent severe without psychotic features: Principal | ICD-10-CM | POA: Diagnosis present

## 2024-02-23 DIAGNOSIS — R45851 Suicidal ideations: Secondary | ICD-10-CM | POA: Diagnosis present

## 2024-02-23 DIAGNOSIS — H02105 Unspecified ectropion of left lower eyelid: Secondary | ICD-10-CM | POA: Diagnosis present

## 2024-02-23 DIAGNOSIS — H903 Sensorineural hearing loss, bilateral: Secondary | ICD-10-CM | POA: Diagnosis present

## 2024-02-23 DIAGNOSIS — I2510 Atherosclerotic heart disease of native coronary artery without angina pectoris: Secondary | ICD-10-CM | POA: Diagnosis present

## 2024-02-23 DIAGNOSIS — Z79899 Other long term (current) drug therapy: Secondary | ICD-10-CM

## 2024-02-23 DIAGNOSIS — H02206 Unspecified lagophthalmos left eye, unspecified eyelid: Secondary | ICD-10-CM | POA: Diagnosis not present

## 2024-02-23 DIAGNOSIS — Z8249 Family history of ischemic heart disease and other diseases of the circulatory system: Secondary | ICD-10-CM | POA: Diagnosis not present

## 2024-02-23 DIAGNOSIS — H02155 Paralytic ectropion of left lower eyelid: Secondary | ICD-10-CM | POA: Diagnosis present

## 2024-02-23 DIAGNOSIS — F3132 Bipolar disorder, current episode depressed, moderate: Secondary | ICD-10-CM | POA: Diagnosis not present

## 2024-02-23 DIAGNOSIS — X749XXS Intentional self-harm by unspecified firearm discharge, sequela: Secondary | ICD-10-CM | POA: Diagnosis not present

## 2024-02-23 DIAGNOSIS — F319 Bipolar disorder, unspecified: Secondary | ICD-10-CM | POA: Diagnosis not present

## 2024-02-23 LAB — GLUCOSE, CAPILLARY
Glucose-Capillary: 104 mg/dL — ABNORMAL HIGH (ref 70–99)
Glucose-Capillary: 126 mg/dL — ABNORMAL HIGH (ref 70–99)

## 2024-02-23 MED ORDER — ACETAMINOPHEN 325 MG PO TABS
650.0000 mg | ORAL_TABLET | Freq: Four times a day (QID) | ORAL | Status: DC | PRN
  Administered 2024-02-25 (×2): 650 mg via ORAL

## 2024-02-23 MED ORDER — ARTIFICIAL TEARS OPHTHALMIC OINT
TOPICAL_OINTMENT | Freq: Every day | OPHTHALMIC | Status: DC
  Filled 2024-02-23: qty 1

## 2024-02-23 MED ORDER — LATANOPROST 0.005 % OP SOLN: 1.0000 [drp] | Freq: Every day | OPHTHALMIC | 12 refills | Status: AC

## 2024-02-23 MED ORDER — BACITRACIN ZINC 500 UNIT/GM EX OINT: TOPICAL_OINTMENT | Freq: Three times a day (TID) | CUTANEOUS | Status: AC

## 2024-02-23 MED ORDER — OXYCODONE HCL 5 MG PO TABS
2.5000 mg | ORAL_TABLET | ORAL | Status: DC | PRN
  Administered 2024-02-23 – 2024-02-26 (×30): 5 mg via ORAL
  Filled 2024-02-23 (×15): qty 1

## 2024-02-23 MED ORDER — DOXAZOSIN MESYLATE 1 MG PO TABS
1.0000 mg | ORAL_TABLET | Freq: Every day | ORAL | Status: DC
  Administered 2024-02-24 – 2024-02-26 (×6): 1 mg via ORAL
  Filled 2024-02-23 (×4): qty 1

## 2024-02-23 MED ORDER — POLYVINYL ALCOHOL 1.4 % OP SOLN
2.0000 [drp] | OPHTHALMIC | Status: DC
  Administered 2024-02-23 – 2024-02-27 (×66): 2 [drp] via OPHTHALMIC
  Filled 2024-02-23: qty 15

## 2024-02-23 MED ORDER — SERTRALINE HCL 50 MG PO TABS
100.0000 mg | ORAL_TABLET | Freq: Every day | ORAL | Status: DC
  Administered 2024-02-24 – 2024-02-26 (×6): 100 mg via ORAL
  Filled 2024-02-23 (×3): qty 2

## 2024-02-23 MED ORDER — MAGNESIUM HYDROXIDE 400 MG/5ML PO SUSP: 30.0000 mL | Freq: Every day | ORAL | Status: DC | PRN

## 2024-02-23 MED ORDER — GUAIFENESIN 100 MG/5ML PO LIQD: 5.0000 mL | ORAL | Status: DC | PRN

## 2024-02-23 MED ORDER — ENSURE PLUS HIGH PROTEIN PO LIQD: 237.0000 mL | Freq: Three times a day (TID) | ORAL | Status: AC

## 2024-02-23 MED ORDER — LOPERAMIDE HCL 2 MG PO CAPS: 2.0000 mg | ORAL_CAPSULE | Freq: Two times a day (BID) | ORAL | Status: AC

## 2024-02-23 MED ORDER — LATANOPROST 0.005 % OP SOLN
1.0000 [drp] | Freq: Every day | OPHTHALMIC | Status: DC
  Administered 2024-02-23 – 2024-02-26 (×8): 1 [drp] via OPHTHALMIC
  Filled 2024-02-23: qty 2.5

## 2024-02-23 MED ORDER — OLANZAPINE 5 MG PO TBDP: 5.0000 mg | ORAL_TABLET | Freq: Three times a day (TID) | ORAL | Status: DC | PRN

## 2024-02-23 MED ORDER — ALUM & MAG HYDROXIDE-SIMETH 200-200-20 MG/5ML PO SUSP: 30.0000 mL | ORAL | Status: DC | PRN

## 2024-02-23 MED ORDER — TIMOLOL MALEATE 0.5 % OP SOLN
1.0000 [drp] | Freq: Every day | OPHTHALMIC | Status: DC
  Administered 2024-02-24 – 2024-02-27 (×8): 1 [drp] via OPHTHALMIC
  Filled 2024-02-23: qty 5

## 2024-02-23 MED ORDER — SERTRALINE HCL 100 MG PO TABS: 100.0000 mg | ORAL_TABLET | Freq: Every day | ORAL | Status: DC

## 2024-02-23 MED ORDER — RAMELTEON 8 MG PO TABS: 8.0000 mg | ORAL_TABLET | Freq: Every day | ORAL | Status: AC

## 2024-02-23 MED ORDER — OLANZAPINE 10 MG IM SOLR: 5.0000 mg | Freq: Three times a day (TID) | INTRAMUSCULAR | Status: DC | PRN

## 2024-02-23 MED ORDER — POLYVINYL ALCOHOL 1.4 % OP SOLN: 2.0000 [drp] | OPHTHALMIC | Status: AC

## 2024-02-23 MED ORDER — ACETAMINOPHEN 325 MG PO TABS: 650.0000 mg | ORAL_TABLET | Freq: Four times a day (QID) | ORAL | Status: DC

## 2024-02-23 MED ORDER — ENSURE PLUS HIGH PROTEIN PO LIQD
237.0000 mL | Freq: Three times a day (TID) | ORAL | Status: DC
  Administered 2024-02-23 – 2024-02-26 (×18): 237 mL via ORAL

## 2024-02-23 MED ORDER — LOPERAMIDE HCL 2 MG PO CAPS
2.0000 mg | ORAL_CAPSULE | Freq: Two times a day (BID) | ORAL | Status: DC
  Administered 2024-02-24 – 2024-02-25 (×4): 2 mg via ORAL
  Filled 2024-02-23 (×6): qty 1

## 2024-02-23 MED ORDER — ARTIFICIAL TEARS OPHTHALMIC OINT: TOPICAL_OINTMENT | Freq: Every day | OPHTHALMIC | Status: AC

## 2024-02-23 MED ORDER — ENOXAPARIN SODIUM 30 MG/0.3ML IJ SOSY
30.0000 mg | PREFILLED_SYRINGE | Freq: Two times a day (BID) | INTRAMUSCULAR | Status: DC
  Administered 2024-02-24 – 2024-02-26 (×12): 30 mg via SUBCUTANEOUS
  Filled 2024-02-23 (×8): qty 0.3

## 2024-02-23 MED ORDER — ADULT MULTIVITAMIN W/MINERALS CH
1.0000 | ORAL_TABLET | Freq: Every day | ORAL | Status: DC
  Administered 2024-02-24 – 2024-02-26 (×6): 1 via ORAL
  Filled 2024-02-23 (×3): qty 1

## 2024-02-23 MED ORDER — RAMELTEON 8 MG PO TABS
8.0000 mg | ORAL_TABLET | Freq: Every day | ORAL | Status: DC
  Administered 2024-02-23 – 2024-02-26 (×8): 8 mg via ORAL
  Filled 2024-02-23 (×4): qty 1

## 2024-02-23 MED ORDER — TIMOLOL MALEATE 0.5 % OP SOLN: 1.0000 [drp] | Freq: Every day | OPHTHALMIC | Status: AC

## 2024-02-23 MED ORDER — OXYCODONE HCL 5 MG PO TABS: 2.5000 mg | ORAL_TABLET | Freq: Four times a day (QID) | ORAL | 0 refills | Status: DC | PRN

## 2024-02-23 MED ORDER — ADULT MULTIVITAMIN W/MINERALS CH: 1.0000 | ORAL_TABLET | Freq: Every day | ORAL | Status: AC

## 2024-02-23 MED ORDER — BACITRACIN ZINC 500 UNIT/GM EX OINT
TOPICAL_OINTMENT | Freq: Three times a day (TID) | CUTANEOUS | Status: DC
  Administered 2024-02-23 – 2024-02-25 (×6): 1 via TOPICAL
  Filled 2024-02-23 (×3): qty 0.9

## 2024-02-23 MED ORDER — ACETAMINOPHEN 325 MG PO TABS
650.0000 mg | ORAL_TABLET | Freq: Four times a day (QID) | ORAL | Status: DC
  Administered 2024-02-23 – 2024-02-25 (×8): 650 mg via ORAL
  Filled 2024-02-23 (×5): qty 2

## 2024-02-23 MED ORDER — DOXAZOSIN MESYLATE 1 MG PO TABS: 1.0000 mg | ORAL_TABLET | Freq: Every day | ORAL | Status: AC

## 2024-02-23 MED ORDER — DIPHENHYDRAMINE HCL 12.5 MG/5ML PO ELIX: 12.5000 mg | ORAL_SOLUTION | Freq: Every evening | ORAL | Status: DC | PRN

## 2024-02-23 NOTE — Group Note (Signed)
 Date:  02/23/2024 Time:  5:46 PM  Group Topic/Focus:  Goals Group:   The focus of this group is to help patients establish daily goals to achieve during treatment and discuss how the patient can incorporate goal setting into their daily lives to aide in recovery. Self Care:   The focus of this group is to help patients understand the importance of self-care in order to improve or restore emotional, physical, spiritual, interpersonal, and financial health. Healthy Eating habits!     Participation Level:  Did Not Attend  Participation Quality:    Affect:    Cognitive:  Insight:   Engagement in Group:    Modes of Intervention:    Additional Comments:    Dajai Wahlert L Ketsia Linebaugh 02/23/2024, 5:46 PM

## 2024-02-23 NOTE — Progress Notes (Signed)
   02/23/24 1700  Psych Admission Type (Psych Patients Only)  Admission Status Voluntary  Psychosocial Assessment  Patient Complaints Depression  Eye Contact Brief  Facial Expression Sad  Affect Sad;Sullen;Depressed  Speech Soft  Interaction Assertive  Motor Activity Slow  Appearance/Hygiene In hospital gown  Behavior Characteristics Calm  Mood Depressed  Thought Process  Coherency WDL  Content WDL  Delusions None reported or observed  Perception WDL  Hallucination None reported or observed  Judgment Impaired  Confusion None  Danger to Self  Current suicidal ideation? Denies  Danger to Others  Danger to Others None reported or observed

## 2024-02-23 NOTE — Progress Notes (Signed)
 Patient's belongings collected and returned to son and daughter-in-law. Patient transported off unit in wheelchair. Departed campus in [employer].

## 2024-02-23 NOTE — TOC Transition Note (Signed)
 Transition of Care [facility]) - Discharge Note   Patient Details  Name: DEMETRE MONACO MRN: [id number] Date of Birth: Mar 21, 1934  Transition of [facility]) CM/SW Contact:  Gwenn Julien Norris, [place] Phone Number: 02/23/2024, 12:06 PM   Clinical Narrative:  Patient for dc to [facility] geropsych unit. RN provided with number for report and will arrange Safe Transport. Voluntary consent completed 9/18 and is located in EMR. SW signing off at dc.   Julien Gwenn, MSW, LCSW [phone] (coverage)       Final next level of care: [facility] Barriers to Discharge: Barriers Resolved   Patient Goals and CMS Choice            Discharge Placement                Patient to be transferred to facility by: Safe Transport Name of family member notified: Pt to update family Patient and family notified of of transfer: 02/23/24  Discharge Plan and Services Additional resources added to the After Visit Summary for     Discharge Planning Services: CM Consult                                 Social Drivers of Health (SDOH) Interventions SDOH Screenings   Housing: Low Risk  (01/31/2024)  Transportation Needs: No Transportation Needs (01/31/2024)  Utilities: Not At Risk (01/31/2024)  Social Connections: Moderately Isolated (01/31/2024)  Tobacco Use: Low Risk  (02/01/2024)     Readmission Risk Interventions     No data to display
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
## 2024-02-23 NOTE — Tx Team (Signed)
 Initial Treatment Plan 02/23/2024 5:17 PM [name] [id number]    PATIENT STRESSORS: Caregiver strain   PATIENT STRENGTHS: Ability for insight  Supportive family/friends    PATIENT IDENTIFIED PROBLEMS:   Depression 8/10                   DISCHARGE CRITERIA:  Ability to meet basic life and health needs Adequate post-discharge living arrangements Improved stabilization in mood, thinking, and/or behavior Medical problems require only outpatient monitoring Safe-care adequate arrangements made  PRELIMINARY DISCHARGE PLAN: Return to previous living arrangement  PATIENT/FAMILY INVOLVEMENT: This treatment plan has been presented to and reviewed with the patient, [name] [name]. The patient has been given the opportunity to ask questions and make suggestions.   Garen CINDERELLA Daring, RN 02/23/2024, 5:17 PM
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
## 2024-02-23 NOTE — Progress Notes (Signed)
 Report given to [facility] Psych nurse. Safe transport contacted.

## 2024-02-23 NOTE — Plan of Care (Signed)
  Problem: Education: Goal: Verbalization of understanding the information provided will improve Outcome: Progressing   Problem: Coping: Goal: Ability to verbalize frustrations and anger appropriately will improve Outcome: Progressing   Problem: Education: Goal: Emotional status will improve Outcome: Not Progressing Goal: Mental status will improve Outcome: Not Progressing

## 2024-02-24 DIAGNOSIS — R45851 Suicidal ideations: Secondary | ICD-10-CM | POA: Diagnosis not present

## 2024-02-24 DIAGNOSIS — F319 Bipolar disorder, unspecified: Secondary | ICD-10-CM

## 2024-02-24 NOTE — Plan of Care (Signed)
°  Problem: Education: °Goal: Utilization of techniques to improve thought processes will improve °Outcome: Progressing °Goal: Knowledge of the prescribed therapeutic regimen will improve °Outcome: Progressing °  °Problem: Activity: °Goal: Interest or engagement in leisure activities will improve °Outcome: Progressing °Goal: Imbalance in normal sleep/wake cycle will improve °Outcome: Progressing °  °Problem: Coping: °Goal: Coping ability will improve °Outcome: Progressing °Goal: Will verbalize feelings °Outcome: Progressing °  °Problem: Health Behavior/Discharge Planning: °Goal: Ability to make decisions will improve °Outcome: Progressing °Goal: Compliance with therapeutic regimen will improve °Outcome: Progressing °  °Problem: Role Relationship: °Goal: Will demonstrate positive changes in social behaviors and relationships °Outcome: Progressing °  °Problem: Safety: °Goal: Ability to disclose and discuss suicidal ideas will improve °Outcome: Progressing °Goal: Ability to identify and utilize support systems that promote safety will improve °Outcome: Progressing °  °Problem: Self-Concept: °Goal: Will verbalize positive feelings about self °Outcome: Progressing °Goal: Level of anxiety will decrease °Outcome: Progressing °  °Problem: Education: °Goal: Knowledge of [facility] Education information/materials will improve °Outcome: Progressing °Goal: Emotional status will improve °Outcome: Progressing °Goal: Mental status will improve °Outcome: Progressing °Goal: Verbalization of understanding the information provided will improve °Outcome: Progressing °  °Problem: Activity: °Goal: Interest or engagement in activities will improve °Outcome: Progressing °Goal: Sleeping patterns will improve °Outcome: Progressing °  °Problem: Coping: °Goal: Ability to verbalize frustrations and anger appropriately will improve °Outcome: Progressing °Goal: Ability to demonstrate self-control will improve °Outcome: Progressing °   °Problem: Health Behavior/Discharge Planning: °Goal: Identification of resources available to assist in meeting health care needs will improve °Outcome: Progressing °Goal: Compliance with treatment plan for underlying cause of condition will improve °Outcome: Progressing °  °Problem: Physical Regulation: °Goal: Ability to maintain clinical measurements within normal limits will improve °Outcome: Progressing °  °Problem: Safety: °Goal: Periods of time without injury will increase °Outcome: Progressing °  °

## 2024-02-24 NOTE — [facility] Suicide Risk Assessment (Signed)
 [facility] Admission Suicide Risk Assessment   Nursing information obtained from:  Patient Demographic factors:  Male, Age 89 or older, Caucasian, Living alone Current Mental Status:  Self-harm behaviors Loss Factors:  Loss of significant relationship Historical Factors:  NA Risk Reduction Factors:  NA  Principal Problem: Major depressive disorder, recurrent severe without psychotic features (HCC) Diagnosis:  Principal Problem:   Major depressive disorder, recurrent severe without psychotic features (HCC)  Subjective Data: See HPI  Continued Clinical Symptoms:  Alcohol  Use Disorder Identification Test Final Score (AUDIT): 1 The Alcohol  Use Disorders Identification Test, Guidelines for Use in Primary Care, Second Edition.  World Science writer [facility]). Score between 0-7:  no or low risk or alcohol  related problems. Score between 8-15:  moderate risk of alcohol  related problems. Score between 16-19:  high risk of alcohol  related problems. Score 20 or above:  warrants further diagnostic evaluation for alcohol  dependence and treatment.   CLINICAL FACTORS:   Depression:   Anhedonia Hopelessness Impulsivity Insomnia Medical Diagnoses and Treatments/Surgeries   Musculoskeletal: Strength & Muscle Tone: within normal limits Gait & Station: normal Patient leans: N/A   Mental status exam - Appearance- casual grooming /dress. Appears stated age.  In hospital clothes.  Alert and oriented x3. Behavior - cooperative.  No acute distress. Motor activity-positive psychomotor agitation Speech - normal rate rhythm volume and tone. [place] - within normal limits Mood-  "depressed" Affect -dysphoric Thought Perception-No auditory and visual hallucinations. Does not appear to be responding to internal stimuli . Thought content -within normal limits.  No suicidal or homicidal thoughts. Thought process -linear.  Association intact . Memory -intact . Fund of knowledge -intact . Attention  -intact . Insight and judgment limited Estimated Level of intellectual functioning-average. Estimated level of functioning-average.   Physical Exam: Physical Exam ROS Blood pressure 125/72, pulse 65, temperature 98 F (36.7 C), resp. rate 18, height 5' 6 (1.676 m), weight 72.6 kg, SpO2 97%. Body mass index is 25.82 kg/m.   COGNITIVE FEATURES THAT CONTRIBUTE TO RISK:  Polarized thinking    SUICIDE RISK:   Moderate:  Frequent suicidal ideation with limited intensity, and duration, some specificity in terms of plans, no associated intent, good self-control, limited dysphoria/symptomatology, some risk factors present, and identifiable protective factors, including available and accessible social support.  PLAN OF CARE: Admit  and stabilize  I certify that inpatient services furnished can reasonably be expected to improve the patient's condition.   Desmond Chimera, MD 02/24/2024, 6:45 AM

## 2024-02-24 NOTE — Group Note (Signed)
 Date:  02/24/2024 Time:  11:29 AM  Group Topic/Focus:  Healthy Communication:   The focus of this group is to discuss communication, barriers to communication, as well as healthy ways to communicate with others.    Participation Level:  Active  Participation Quality:  Appropriate  Affect:  Appropriate  Cognitive:  Appropriate  Insight: Appropriate  Engagement in Group:  Engaged  Modes of Intervention:  Activity  Additional Comments:    [name] [name] 02/24/2024, 11:29 AM

## 2024-02-24 NOTE — Progress Notes (Signed)
   02/24/24 0830  Psych Admission Type (Psych Patients Only)  Admission Status Voluntary  Psychosocial Assessment  Patient Complaints None  Eye Contact Brief  Facial Expression Sad  Affect Sad;Sullen;Depressed  Speech Soft  Interaction Assertive  Motor Activity Slow  Appearance/Hygiene In hospital gown  Behavior Characteristics Cooperative  Mood Depressed;Sullen  Thought Process  Coherency WDL  Content WDL  Delusions None reported or observed  Perception WDL  Hallucination None reported or observed  Judgment Impaired  Confusion None  Danger to Self  Current suicidal ideation? Denies  Danger to Others  Danger to Others None reported or observed
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
## 2024-02-24 NOTE — Progress Notes (Signed)
   02/24/24 2000  Psych Admission Type (Psych Patients Only)  Admission Status Voluntary  Psychosocial Assessment  Patient Complaints None  Eye Contact Brief  Facial Expression Sad  Affect Sad;Sullen;Depressed  Speech Soft  Interaction Assertive  Motor Activity Slow  Appearance/Hygiene In hospital gown  Behavior Characteristics Cooperative  Mood Depressed;Sullen  Thought Process  Coherency WDL  Content WDL  Delusions None reported or observed  Perception WDL  Hallucination None reported or observed  Judgment Impaired  Confusion None  Danger to Self  Current suicidal ideation? Denies  Danger to Others  Danger to Others None reported or observed

## 2024-02-24 NOTE — Group Note (Signed)
 Date:  02/24/2024 Time:  9:43 PM  Group Topic/Focus:  Wrap-Up Group:   The focus of this group is to help patients review their daily goal of treatment and discuss progress on daily workbooks.    Participation Level:  Did Not Attend  Participation Quality:     Affect:     Cognitive:     Insight: None  Engagement in Group:  None  Modes of Intervention:     Additional Comments:    Tommas [name] 02/24/2024, 9:43 PM

## 2024-02-24 NOTE — Group Note (Signed)
 LCSW Group Therapy Note  Group Date: 02/24/2024 Start Time: 1300 End Time: 1340   Type of Therapy and Topic:  Group Therapy - Healthy vs Unhealthy Coping Skills  Participation Level:  Did Not Attend   Description of Group The focus of this group was to determine what unhealthy coping techniques typically are used by group members and what healthy coping techniques would be helpful in coping with various problems. Patients were guided in becoming aware of the differences between healthy and unhealthy coping techniques. Patients were asked to identify 2-3 healthy coping skills they would like to learn to use more effectively.  Therapeutic Goals Patients learned that coping is what human beings do all day long to deal with various situations in their lives Patients defined and discussed healthy vs unhealthy coping techniques Patients identified their preferred coping techniques and identified whether these were healthy or unhealthy Patients determined 2-3 healthy coping skills they would like to become more familiar with and use more often. Patients provided support and ideas to each other   Summary of Patient Progress:  The patient did not attend group.   Therapeutic Modalities Cognitive Behavioral Therapy   Renella Steig S Amberle Lyter, LCSWA 02/24/2024  4:34 PM

## 2024-02-25 DIAGNOSIS — F332 Major depressive disorder, recurrent severe without psychotic features: Principal | ICD-10-CM

## 2024-02-25 NOTE — BH IP Treatment Plan (Signed)
 Interdisciplinary Treatment and Diagnostic Plan Update  02/25/2024 Time of Session: 10:38 AM [name] [name] MRN: [id number]  Principal Diagnosis: Major depressive disorder, recurrent severe without psychotic features (HCC)  Secondary Diagnoses: Principal Problem:   Major depressive disorder, recurrent severe without psychotic features (HCC)   Current Medications:  Current Facility-Administered Medications  Medication Dose Route Frequency Provider Last Rate Last Admin   acetaminophen  (TYLENOL ) tablet 650 mg  650 mg Oral Q6H Jacquetta Sharlot GRADE, NP   650 mg at 02/24/24 1223   acetaminophen  (TYLENOL ) tablet 650 mg  650 mg Oral Q6H PRN Lord, Jamison Y, NP       alum & mag hydroxide-simeth (MAALOX/MYLANTA) 200-200-20 MG/5ML suspension 30 mL  30 mL Oral Q4H PRN Lord, Jamison Y, NP       artificial tears (LACRILUBE) ophthalmic ointment   Left Eye QHS Jacquetta Sharlot GRADE, NP   Given at 02/24/24 2121   artificial tears ophthalmic solution 2 drop  2 drop Left Eye Q1H while awake Jacquetta Sharlot GRADE, NP   2 drop at 02/25/24 1057   bacitracin  ointment   Topical TID Jacquetta Sharlot GRADE, NP   1 Application at 02/25/24 1001   diphenhydrAMINE  (BENADRYL ) 12.5 MG/5ML elixir 12.5 mg  12.5 mg Oral QHS PRN Jacquetta Sharlot GRADE, NP       doxazosin  (CARDURA ) tablet 1 mg  1 mg Oral Daily Jacquetta Sharlot GRADE, NP   1 mg at 02/25/24 1000   enoxaparin  (LOVENOX ) injection 30 mg  30 mg Subcutaneous Q12H Jacquetta Sharlot GRADE, NP   30 mg at 02/25/24 1000   feeding supplement (ENSURE PLUS HIGH PROTEIN) liquid 237 mL  237 mL Oral TID BM Jacquetta Sharlot GRADE, NP   237 mL at 02/24/24 2025   guaiFENesin  (ROBITUSSIN) 100 MG/5ML liquid 5 mL  5 mL Oral Q4H PRN Jacquetta Sharlot GRADE, NP       latanoprost  (XALATAN ) 0.005 % ophthalmic solution 1 drop  1 drop Both Eyes QHS Jacquetta Sharlot GRADE, NP   1 drop at 02/24/24 2122   loperamide  (IMODIUM ) capsule 2 mg  2 mg Oral BID Jacquetta Sharlot GRADE, NP   2 mg at 02/24/24 2122   magnesium  hydroxide (MILK OF MAGNESIA) suspension 30 mL  30  mL Oral Daily PRN Lord, Jamison Y, NP       multivitamin with minerals tablet 1 tablet  1 tablet Oral Daily Jacquetta Sharlot GRADE, NP   1 tablet at 02/25/24 1000   OLANZapine  (ZYPREXA ) injection 5 mg  5 mg Intramuscular TID PRN Lord, Jamison Y, NP       OLANZapine  zydis (ZYPREXA ) disintegrating tablet 5 mg  5 mg Oral TID PRN Jacquetta Sharlot GRADE, NP       oxyCODONE  (Oxy IR/ROXICODONE ) immediate release tablet 2.5-5 mg  2.5-5 mg Oral Q4H PRN Jacquetta Sharlot GRADE, NP   5 mg at 02/25/24 0825   ramelteon  (ROZEREM ) tablet 8 mg  8 mg Oral QHS Jacquetta Sharlot GRADE, NP   8 mg at 02/24/24 2122   sertraline  (ZOLOFT ) tablet 100 mg  100 mg Oral Daily Jacquetta Sharlot GRADE, NP   100 mg at 02/25/24 1000   timolol  (TIMOPTIC ) 0.5 % ophthalmic solution 1 drop  1 drop Both Eyes Daily Jacquetta Sharlot GRADE, NP   1 drop at 02/25/24 1001   PTA Medications: Medications Prior to Admission  Medication Sig Dispense Refill Last Dose/Taking   acetaminophen  (TYLENOL ) 325 MG tablet Take 2 tablets (650 mg total) by mouth every 6 (six) hours.  artificial tears (LACRILUBE) OINT ophthalmic ointment Place into the left eye at bedtime.      artificial tears ophthalmic solution Place 2 drops into the left eye every hour while awake.      atorvastatin  (LIPITOR) 20 MG tablet Take 20 mg by mouth daily.      bacitracin  ointment Apply topically 3 (three) times daily.      doxazosin  (CARDURA ) 1 MG tablet Take 1 tablet (1 mg total) by mouth daily.      feeding supplement (ENSURE PLUS HIGH PROTEIN) LIQD Take 237 mLs by mouth 3 (three) times daily between meals.      latanoprost  (XALATAN ) 0.005 % ophthalmic solution Place 1 drop into both eyes at bedtime. 2.5 mL 12    loperamide  (IMODIUM ) 2 MG capsule Take 1 capsule (2 mg total) by mouth 2 (two) times daily.      Multiple Vitamin (MULTIVITAMIN WITH MINERALS) TABS tablet Take 1 tablet by mouth daily.      oxyCODONE  (OXY IR/ROXICODONE ) 5 MG immediate release tablet Take 0.5 tablets (2.5 mg total) by mouth every 6 (six)  hours as needed for severe pain (pain score 7-10). 10 tablet 0    ramelteon  (ROZEREM ) 8 MG tablet Take 1 tablet (8 mg total) by mouth at bedtime.      sertraline  (ZOLOFT ) 100 MG tablet Take 1 tablet (100 mg total) by mouth daily.      timolol  (TIMOPTIC ) 0.5 % ophthalmic solution Place 1 drop into both eyes daily.       Patient Stressors:    Patient Strengths: Ability for insight  Supportive family/friends   Treatment Modalities: Medication Management, Group therapy, Case management,  1 to 1 session with clinician, Psychoeducation, Recreational therapy.   Physician Treatment Plan for Primary Diagnosis: Major depressive disorder, recurrent severe without psychotic features (HCC) Long Term Goal(s):     Short Term Goals:    Medication Management: Evaluate patient's response, side effects, and tolerance of medication regimen.  Therapeutic Interventions: 1 to 1 sessions, Unit Group sessions and Medication administration.  Evaluation of Outcomes: Not Met  Physician Treatment Plan for Secondary Diagnosis: Principal Problem:   Major depressive disorder, recurrent severe without psychotic features (HCC)  Long Term Goal(s):     Short Term Goals:       Medication Management: Evaluate patient's response, side effects, and tolerance of medication regimen.  Therapeutic Interventions: 1 to 1 sessions, Unit Group sessions and Medication administration.  Evaluation of Outcomes: Not Met   RN Treatment Plan for Primary Diagnosis: Major depressive disorder, recurrent severe without psychotic features (HCC) Long Term Goal(s): Knowledge of disease and therapeutic regimen to maintain health will improve  Short Term Goals: Ability to verbalize frustration and anger appropriately will improve, Ability to demonstrate self-control, Ability to participate in decision making will improve, Ability to verbalize feelings will improve, Ability to disclose and discuss suicidal ideas, and Ability to identify  and develop effective coping behaviors will improve  Medication Management: RN will administer medications as ordered by provider, will assess and evaluate patient's response and provide education to patient for prescribed medication. RN will report any adverse and/or side effects to prescribing provider.  Therapeutic Interventions: 1 on 1 counseling sessions, Psychoeducation, Medication administration, Evaluate responses to treatment, Monitor vital signs and CBGs as ordered, Perform/monitor CIWA, COWS, AIMS and Fall Risk screenings as ordered, Perform wound care treatments as ordered.  Evaluation of Outcomes: Not Met   LCSW Treatment Plan for Primary Diagnosis: Major depressive disorder, recurrent severe without psychotic  features [facility]) Long Term Goal(s): Safe transition to appropriate next level of care at discharge, Engage patient in therapeutic group addressing interpersonal concerns.  Short Term Goals: Engage patient in aftercare planning with referrals and resources, Increase social support, Increase ability to appropriately verbalize feelings, Increase emotional regulation, Facilitate acceptance of mental health diagnosis and concerns, Facilitate patient progression through stages of change regarding substance use diagnoses and concerns, Identify triggers associated with mental health/substance abuse issues, and Increase skills for wellness and recovery  Therapeutic Interventions: Assess for all discharge needs, 1 to 1 time with Social worker, Explore available resources and support systems, Assess for adequacy in community support network, Educate family and significant other(s) on suicide prevention, Complete Psychosocial Assessment, Interpersonal group therapy.  Evaluation of Outcomes: Not Met   Progress in Treatment: Attending groups: Yes. and No. Participating in groups: Yes. and No. Taking medication as prescribed: Yes. Toleration medication: Yes. Family/Significant other contact  made: No, will contact:  CSW to contact once permission is granted.  Patient understands diagnosis: Yes. Discussing patient identified problems/goals with staff: Yes. Medical problems stabilized or resolved: Yes. and No. Denies suicidal/homicidal ideation: Yes. Issues/concerns per patient self-inventory: No. Other: None  New problem(s) identified: No, Describe:  None  New Short Term/Long Term Goal(s):detox, elimination of symptoms of psychosis, medication management for mood stabilization; elimination of SI thoughts; development of comprehensive mental wellness/sobriety plan.    Patient Goals:  See my general mood improve.  Discharge Plan or Barriers: CSW to assist with the development of appropriate discharge plan.    Reason for Continuation of Hospitalization: Anxiety Depression Medical Issues Medication stabilization Suicidal ideation  Estimated Length of Stay: 1-7 days.   Last 3 [place] Suicide Severity Risk Score: Flowsheet Row Admission (Current) from 02/23/2024 in [facility]  C-SSRS RISK CATEGORY High Risk    Last PHQ 2/9 Scores:     No data to display          Scribe for Treatment Team: Oluwademilade Mckiver M Rosiland Sen, LCSW 02/25/2024 10:59 AM

## 2024-02-25 NOTE — Group Note (Signed)
 Recreation Therapy Group Note   Group Topic:Other  Group Date: 02/25/2024 Start Time: 1400 End Time: 1445 Facilitators: Celestia Jeoffrey BRAVO, LRT, CTRS Location: Dayroom  Activity Description/Intervention: Therapeutic Drumming. Patients with peers and staff were given the opportunity to engage in a leader facilitated [facility] Group Empowerment Drumming Circle with staff from the [employer], in partnership with [employer]. Teaching laboratory technician and trained [employer], Norleen Mon leading with LRT observing and documenting intervention and pt response. This evidenced-based practice targets 7 areas of health and wellbeing in the human experience including: stress-reduction, exercise, self-expression, camaraderie/support, nurturing, spirituality, and music-making (leisure).    Goal Area(s) Addresses:  Patient will engage in pro-social way in music group.  Patient will follow directions of drum leader on the first prompt. Patient will demonstrate no behavioral issues during group.  Patient will identify if a reduction in stress level occurs as a result of participation in therapeutic drum circle.     Affect/Mood: N/A   Participation Level: Did not attend    Clinical Observations/Individualized Feedback: Patient did not attend group.  Plan: Continue to engage patient in RT group sessions 2-3x/week.   Jeoffrey BRAVO Celestia, LRT, CTRS 02/25/2024 4:26 PM

## 2024-02-25 NOTE — Group Note (Signed)
 Physical/Occupational Therapy Group Note  Group Topic: Yoga  Group Date: 02/25/2024 Start Time: 1300 End Time: 1330 Facilitators: Reaghan Kawa, Alm Hamilton, PT   Group Description: Group participated with series of yoga poses, designed to emphasize functional standing balance, core stability, generalized flexibility and overall posture.  Incorporated deep breathing techniques with poses, working to promote relaxation, mindfulness and focus with targeted activities.   Discussed benefits of yoga in improving mood and self-esteem, reducing stress and anxiety, and promoting functional strength and balance for each participant.  Discussed ways to integrate into each participant's daily routine.  Provided handout with written and pictorial descriptions of included yoga movements to be utilized as appropriate outside of group time.  Therapeutic Goal(s):  Demonstrate safe ability to participate with yoga poses during group activity. Identify one benefit of participation with yoga poses as part of each participant's exercise/movement routine. Identify 1-2 individual poses that participant feels most beneficial to his/her needs and that he/she can easily replicate outside of group.  Individual Participation: Did not attend  Participation Level:   Participation Quality:   Behavior:   Speech/Thought Process:   Affect/Mood:   Insight:   Judgement:   Modes of Intervention:   Patient Response to Interventions:    Plan: Continue to engage patient in PT/OT groups 1 - 2x/week.  CHARM Hamilton Bertin PT, DPT 02/25/24, 1:51 PM

## 2024-02-25 NOTE — Group Note (Signed)
 Date:  02/25/2024 Time:  9:09 PM  Group Topic/Focus:  Wrap-Up Group:   The focus of this group is to help patients review their daily goal of treatment and discuss progress on daily workbooks.    Participation Level:  Did Not Attend  Participation Quality:     Affect:     Cognitive:     Insight: None  Engagement in Group:  None  Modes of Intervention:     Additional Comments:    [name] [name] [name] 02/25/2024, 9:09 PM
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
## 2024-02-25 NOTE — Plan of Care (Signed)
   Problem: Coping: Goal: Coping ability will improve Outcome: Progressing

## 2024-02-25 NOTE — Plan of Care (Signed)
  Problem: Activity: Goal: Interest or engagement in leisure activities will improve Outcome: Not Progressing   Problem: Coping: Goal: Coping ability will improve Outcome: Not Progressing Goal: Will verbalize feelings Outcome: Not Progressing

## 2024-02-25 NOTE — Group Note (Signed)
 Date:  02/25/2024 Time:  4:37 PM  Group Topic/Focus:  Coping With Mental Health Crisis:   The purpose of this group is to help patients identify strategies for coping with mental health crisis.  Group discusses possible causes of crisis and ways to manage them effectively.    Participation Level:  Did Not Attend  Participation Quality:    Affect:    Cognitive:    Insight:   Engagement in Group:    Modes of Intervention:    Additional Comments:    Stephan Nelis 02/25/2024, 4:37 PM

## 2024-02-25 NOTE — Progress Notes (Signed)
   02/25/24 2000  Psych Admission Type (Psych Patients Only)  Admission Status Voluntary  Psychosocial Assessment  Patient Complaints Depression  Eye Contact Brief  Facial Expression Sad  Affect Depressed  Speech Soft  Interaction Isolative  Motor Activity Slow  Appearance/Hygiene In scrubs  Behavior Characteristics Cooperative  Mood Depressed  Thought Process  Coherency WDL  Content WDL  Delusions None reported or observed  Perception WDL  Hallucination None reported or observed  Judgment Impaired  Confusion None  Danger to Self  Current suicidal ideation? Denies  Danger to Others  Danger to Others None reported or observed
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
## 2024-02-25 NOTE — Progress Notes (Signed)
 Mood:  Pleasant.  Sad, depressed affect.    Psych assessment: Endorses depression and sadness. Denies SI/HI and AVH.   Interaction / Group attendance:  Isolates to room. Present in the milieu for meals.  Minimal interaction with peers and staff.  Medication/ PRNs: Complainant with scheduled medications. PRN pain medication given as ordered.   Pain: 8/10 (left eye / side of head)  15 min checks in place for safety.    Patient is ordered nectar think liquids, but insisting on taking his medications with sips of water.  Refused taking pills with applesauce or pudding.  RN educated patient this was for his safety (to avoid pneumonia).  Speech made aware and stated Ensure would be a good option for med administration.  Pt is agreeable to Ensure.   NO straws.

## 2024-02-25 NOTE — Progress Notes (Signed)
 Patient is refusing his nectar thick fluids and drinking water ad lib. Patient is willing to take pills with ensure which is the consistency of nectar thick. Per speech, the patient can drink water from bottle or a cup but not with a straw. Aspiration precaution will include sitting up straight when drinking and he must be completely alert as well.  This of course would change IF he gets pneumonia. Pills must be taken with ensure or nectar thick d/t aspiration chance of a pill.

## 2024-02-26 ENCOUNTER — Other Ambulatory Visit: Payer: Self-pay | Admitting: Otolaryngology

## 2024-02-26 NOTE — Group Note (Signed)
 LCSW Group Therapy Note   Group Date: 02/26/2024 Start Time: 1330 End Time: 1400   Type of Therapy and Topic:  Group Therapy: Challenging Core Beliefs  Participation Level:  Did Not Attend  Description of Group:  Patients were educated about core beliefs and asked to identify one harmful core belief that they have. Patients were asked to explore from where those beliefs originate. Patients were asked to discuss how those beliefs make them feel and the resulting behaviors of those beliefs. They were then be asked if those beliefs are true and, if so, what evidence they have to support them. Lastly, group members were challenged to replace those negative core beliefs with helpful beliefs.   Therapeutic Goals:   1. Patient will identify harmful core beliefs and explore the origins of such beliefs. 2. Patient will identify feelings and behaviors that result from those core beliefs. 3. Patient will discuss whether such beliefs are true. 4.  Patient will replace harmful core beliefs with helpful ones.  Summary of Patient Progress:  X  Therapeutic Modalities: Cognitive Behavioral Therapy; Solution-Focused Therapy   Miriana Gaertner D Maudine Kluesner, [place] 02/26/2024  2:31 PM

## 2024-02-26 NOTE — Group Note (Signed)
 Date:  02/26/2024 Time:  11:35 PM  Group Topic/Focus:  Wrap-Up Group:   The focus of this group is to help patients review their daily goal of treatment and discuss progress on daily workbooks.    Participation Level:  Did Not Attend  Participation Quality:     Affect:     Cognitive:     Insight: None  Engagement in Group:  None  Modes of Intervention:     Additional Comments:    [name] [name] 02/26/2024, 11:35 PM

## 2024-02-26 NOTE — [facility] Suicide Risk Assessment (Signed)
 [facility] INPATIENT:  Family/Significant Other Suicide Prevention Education  Suicide Prevention Education:  Education Completed; [place], son, [phone], has been identified by the patient as the family member/significant other with whom the patient will be residing, and identified as the person(s) who will aid the patient in the event of a mental health crisis (suicidal ideations/suicide attempt).  With written consent from the patient, the family member/significant other has been provided the following suicide prevention education, prior to the and/or following the discharge of the patient.  The suicide prevention education provided includes the following: Suicide risk factors Suicide prevention and interventions National Suicide Hotline telephone number [facility] assessment telephone number [facility] Emergency Assistance 911 [facility] and/or Residential Mobile Crisis Unit telephone number  Request made of family/significant other to: Remove weapons (e.g., guns, rifles, knives), all items previously/currently identified as safety concern.   Remove drugs/medications (over-the-counter, prescriptions, illicit drugs), all items previously/currently identified as a safety concern.  The family member/significant other verbalizes understanding of the suicide prevention education information provided.  The family member/significant other agrees to remove the items of safety concern listed above.  According to patient's son, the patient "attempted suicide." Son reported that his was a surprise and that he initially did not consider the patient to be a danger to himself but learned that he is a danger himself following the series of events. According to the son, the patient does drive and will be able to make it to and from appointments. In person appointments would be in better. Son reported that "any start with outpatient will be a good start." Son reported that he is the  patient's POA and with the current situation, the family is working to navigate if the patient will still be safe at home or need to reside with one of the sons. Son reported that the patient does not have access to weapons and that the main safety concerns is ensuring that the patient is not a harm to himself.   [name] 02/26/2024, 11:21 AM

## 2024-02-26 NOTE — Group Note (Signed)
 Recreation Therapy Group Note   Group Topic:Leisure Education  Group Date: 02/26/2024 Start Time: 1400 End Time: 1500 Facilitators: Celestia Jeoffrey BRAVO, LRT, CTRS Location: Courtyard  Group Description: Music. Patients encouraged to name their favorite song(s) for LRT to play song through speaker for group to hear, while in the courtyard getting fresh air and sunlight. Patients educated on the definition of leisure and the importance of having different leisure interests outside of the hospital. Group discussed how leisure activities can often be used as Pharmacologist and that listening to music and being outside are examples.    Goal Area(s) Addressed:  Patient will identify a current leisure interest.  Patient will practice making a positive decision. Patient will have the opportunity to try a new leisure activity.   Affect/Mood: N/A   Participation Level: Did not attend    Clinical Observations/Individualized Feedback: Patient did not attend group.   Plan: Continue to engage patient in RT group sessions 2-3x/week.   Jeoffrey BRAVO Celestia, LRT, CTRS 02/26/2024 4:35 PM
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
## 2024-02-26 NOTE — Pre-Procedure Instructions (Signed)
 [name]  02/26/2024      [id number] - [name], [place] - [address] [name] [facility] [address] Phone: [phone] Fax: [phone]      Surgical Instructions   Your procedure is scheduled on Wed, Sept. 24, 2025 from 2:45 PM-.3:45 PM  Report to [name] at 12:15 P.M.   Call this number if you have problems the morning of surgery:  [phone]  If you experience any cold or flu symptoms such as cough, fever, chills, shortness of breath, etc. between now and your scheduled surgery, please notify us  at the above number.   Remember:  Do not eat after midnight the night before your surgery  You may drink clear liquids until 11:00AM the morning of your surgery.   Clear liquids allowed are: Water, Non-Citrus Juices (without pulp), Carbonated Beverages, Clear Tea, Black Coffee ONLY (NO MILK, CREAM OR POWDERED CREAMER of any kind), and Gatorade   Take these medicines the morning of surgery with A SIP OF WATER:  artificial tears  atorvastatin  (LIPITOR)  doxazosin  (CARDURA )  sertraline  (ZOLOFT )  timolol  (TIMOPTIC )   As Needed: acetaminophen  (TYLENOL )  oxyCODONE  (OXY IR/ROXICODONE )    As of today, STOP taking any Aspirin (unless otherwise instructed by your surgeon) Aleve, Naproxen, Ibuprofen, Motrin, Advil, Goody's, BC's, all herbal medications, fish oil, and all vitamins.             Day of Surgery: Do not wear jewelry  Do not wear lotions, powders, perfumes/colognes, or deodorant. Do not shave arms, armpits, groin, and legs 48 hours prior to surgery.  Men may shave face and neck. Do not bring valuables to the hospital.             [facility] is not responsible for any belongings or valuables.  Do NOT Smoke (Tobacco/Vaping)  24 hours prior to your procedure  If you use a CPAP at night, you may bring your mask and machine for your overnight stay.   Contacts, glasses, hearing aids, dentures or partials may  not be worn into surgery, please bring cases for these belongings   For patients admitted to the hospital, discharge time will be determined by your treatment team.   Patients discharged the day of surgery will not be allowed to drive home, and someone needs to stay with them for 24 hours.  Special instructions:    Oral Hygiene is also important to reduce your risk of infection.  Remember - BRUSH YOUR TEETH THE MORNING OF SURGERY WITH YOUR REGULAR TOOTHPASTE   [name]- Preparing For Surgery  Before surgery, you can play an important role. Because skin is not sterile, your skin needs to be as free of germs as possible. You can reduce the number of germs on your skin by washing with CHG (chlorahexidine gluconate) Soap before surgery.  CHG is an antiseptic cleaner which kills germs and bonds with the skin to continue killing germs even after washing.    Please do not use if you have an allergy to CHG or antibacterial soaps. If your skin becomes reddened/irritated stop using the CHG.  Do not shave (including legs and underarms) for at least 48 hours prior to first CHG shower. It is OK to shave your face.  Please follow these instructions carefully.    Shower the NIGHT BEFORE SURGERY and the MORNING OF SURGERY with CHG Soap.   If you chose to wash your hair, wash your hair first as  usual with your normal shampoo. After you shampoo, rinse your hair and body thoroughly to remove the shampoo.  Then [employer] and genitals (private parts) with your normal soap and rinse thoroughly to remove soap.  After that Use CHG Soap as you would any other liquid soap. You can apply CHG directly to the skin and wash gently with a scrungie or a clean washcloth.   Apply the CHG Soap to your body ONLY FROM THE NECK DOWN.  Do not use on open wounds or open sores. Avoid contact with your eyes, ears, mouth and genitals (private parts). Wash Face and genitals (private parts)  with your normal soap.   Wash  thoroughly, paying special attention to the area where your surgery will be performed.  Thoroughly rinse your body with warm water from the neck down.  DO NOT shower/wash with your normal soap after using and rinsing off the CHG Soap.  Pat yourself dry with a CLEAN TOWEL.  Wear CLEAN PAJAMAS to bed the night before surgery  Place CLEAN SHEETS on your bed the night before your surgery  DO NOT SLEEP WITH PETS.  Reminder: Take a shower with CHG soap. Wear Clean/Comfortable clothing the morning of surgery Do not apply any deodorants/lotions.   Remember to brush your teeth WITH YOUR REGULAR TOOTHPASTE.

## 2024-02-26 NOTE — Plan of Care (Signed)
  Problem: Coping: Goal: Coping ability will improve Outcome: Progressing   Problem: Health Behavior/Discharge Planning: Goal: Compliance with therapeutic regimen will improve Outcome: Progressing   Problem: Activity: Goal: Interest or engagement in leisure activities will improve Outcome: Not Progressing   Problem: Coping: Goal: Will verbalize feelings Outcome: Not Progressing

## 2024-02-26 NOTE — Group Note (Signed)
 Date:  02/26/2024 Time:  11:05 AM  Group Topic/Focus:  Managing Feelings:   The focus of this group is to identify what feelings patients have difficulty handling and develop a plan to handle them in a healthier way upon discharge.    Participation Level:  Active  Participation Quality:  Appropriate  Affect:  Appropriate  Cognitive:  Appropriate  Insight: Appropriate  Engagement in Group:  Engaged  Modes of Intervention:  Discussion   Arland Nutting 02/26/2024, 11:05 AM

## 2024-02-26 NOTE — Plan of Care (Signed)
  Problem: Coping: Goal: Will verbalize feelings Outcome: Progressing   Problem: Education: Goal: Emotional status will improve Outcome: Progressing Goal: Mental status will improve Outcome: Progressing   Problem: Activity: Goal: Interest or engagement in leisure activities will improve Outcome: Not Progressing

## 2024-02-26 NOTE — Progress Notes (Signed)
 Behavior/Mood:  Pleasant and cooperative.  Taking medications with Ensure without issue. No straws.    Psych assessment:  Endorses sadness and depression.  Denies SI.  Contracts for safety.  Interaction / Group attendance:  Isolates to room.  Present in the dayroom only for meals. Minimal interaction with both peers and staff.   Medication/ PRNs: Compliant with scheduled medications.  PRN pain medication given as ordered.  Pain: 8/10 (left eye, ear, face)  15 min checks in place for safety.      Patient will need to arrive at [facility] by 12:15 for surgery.    Patient can not eat after midnight.  Clear liquids allowed until 11 am.

## 2024-02-26 NOTE — Progress Notes (Signed)
(  Sleep Hours) - 10  (Any PRNs that were needed, meds refused, or side effects to meds)- oxycodone  5mg   (Any disturbances and when (visitation, over night)- n/a  (Concerns raised by the patient)- n/a  (SI/HI/AVH)- denies

## 2024-02-27 ENCOUNTER — Encounter
Admission: AD | Disposition: A | Discharge status: Home / Self Care | Payer: Self-pay | Source: Intra-hospital | Attending: Psychiatry

## 2024-02-27 ENCOUNTER — Other Ambulatory Visit: Payer: Self-pay

## 2024-02-27 ENCOUNTER — Inpatient Hospital Stay (HOSPITAL_COMMUNITY): Admitting: Anesthesiology

## 2024-02-27 ENCOUNTER — Encounter (HOSPITAL_COMMUNITY): Payer: Self-pay | Admitting: Anesthesiology

## 2024-02-27 ENCOUNTER — Inpatient Hospital Stay (HOSPITAL_COMMUNITY)
Admission: AD | Admit: 2024-02-27 | Discharge: 2024-03-04 | Disposition: A | Discharge status: Home / Self Care | Source: Intra-hospital | Attending: Psychiatry | Admitting: Psychiatry

## 2024-02-27 ENCOUNTER — Encounter (HOSPITAL_COMMUNITY)
Admission: AD | Disposition: A | Discharge status: Hospice | Payer: Self-pay | Source: Ambulatory Visit | Attending: Otolaryngology

## 2024-02-27 ENCOUNTER — Ambulatory Visit (HOSPITAL_COMMUNITY)
Admission: AD | Admit: 2024-02-27 | Discharge: 2024-02-27 | Disposition: A | Discharge status: Hospice | Source: Ambulatory Visit | Attending: Otolaryngology | Admitting: Otolaryngology

## 2024-02-27 ENCOUNTER — Encounter (HOSPITAL_COMMUNITY): Payer: Self-pay | Admitting: Otolaryngology

## 2024-02-27 DIAGNOSIS — F332 Major depressive disorder, recurrent severe without psychotic features: Principal | ICD-10-CM | POA: Diagnosis present

## 2024-02-27 DIAGNOSIS — H02155 Paralytic ectropion of left lower eyelid: Secondary | ICD-10-CM

## 2024-02-27 DIAGNOSIS — W3400XA Accidental discharge from unspecified firearms or gun, initial encounter: Secondary | ICD-10-CM

## 2024-02-27 DIAGNOSIS — Z9151 Personal history of suicidal behavior: Secondary | ICD-10-CM

## 2024-02-27 DIAGNOSIS — Z8249 Family history of ischemic heart disease and other diseases of the circulatory system: Secondary | ICD-10-CM

## 2024-02-27 DIAGNOSIS — H02206 Unspecified lagophthalmos left eye, unspecified eyelid: Secondary | ICD-10-CM | POA: Diagnosis not present

## 2024-02-27 DIAGNOSIS — I10 Essential (primary) hypertension: Secondary | ICD-10-CM | POA: Diagnosis not present

## 2024-02-27 DIAGNOSIS — Z7901 Long term (current) use of anticoagulants: Secondary | ICD-10-CM

## 2024-02-27 DIAGNOSIS — R45851 Suicidal ideations: Secondary | ICD-10-CM | POA: Diagnosis present

## 2024-02-27 DIAGNOSIS — F3132 Bipolar disorder, current episode depressed, moderate: Secondary | ICD-10-CM | POA: Insufficient documentation

## 2024-02-27 DIAGNOSIS — Z9861 Coronary angioplasty status: Secondary | ICD-10-CM

## 2024-02-27 DIAGNOSIS — I2510 Atherosclerotic heart disease of native coronary artery without angina pectoris: Secondary | ICD-10-CM | POA: Diagnosis present

## 2024-02-27 DIAGNOSIS — Y249XXA Unspecified firearm discharge, undetermined intent, initial encounter: Secondary | ICD-10-CM

## 2024-02-27 DIAGNOSIS — N400 Enlarged prostate without lower urinary tract symptoms: Secondary | ICD-10-CM | POA: Diagnosis present

## 2024-02-27 DIAGNOSIS — Z8546 Personal history of malignant neoplasm of prostate: Secondary | ICD-10-CM

## 2024-02-27 DIAGNOSIS — H903 Sensorineural hearing loss, bilateral: Secondary | ICD-10-CM | POA: Diagnosis present

## 2024-02-27 DIAGNOSIS — E785 Hyperlipidemia, unspecified: Secondary | ICD-10-CM | POA: Diagnosis present

## 2024-02-27 DIAGNOSIS — X749XXS Intentional self-harm by unspecified firearm discharge, sequela: Secondary | ICD-10-CM | POA: Insufficient documentation

## 2024-02-27 DIAGNOSIS — Z79899 Other long term (current) drug therapy: Secondary | ICD-10-CM

## 2024-02-27 HISTORY — PX: ECTROPION REPAIR: SHX357

## 2024-02-27 SURGERY — REPAIR, ECTROPION, EYELID
Anesthesia: Monitor Anesthesia Care | Site: Eye | Laterality: Left

## 2024-02-27 SURGERY — REPAIR, ECTROPION, EYELID
Anesthesia: Monitor Anesthesia Care | Laterality: Left

## 2024-02-27 MED ORDER — TETRACAINE HCL 0.5 % OP SOLN
OPHTHALMIC | Status: AC
Start: 2024-02-27 — End: 2024-02-27
  Filled 2024-02-27: qty 4

## 2024-02-27 MED ORDER — BACITRACIN-POLYMYXIN B 500-10000 UNIT/GM OP OINT
TOPICAL_OINTMENT | OPHTHALMIC | Status: AC
Start: 2024-02-27 — End: 2024-02-27
  Filled 2024-02-27: qty 3.5

## 2024-02-27 MED ORDER — PROPOFOL 10 MG/ML IV BOLUS
INTRAVENOUS | Status: DC | PRN
  Administered 2024-02-27: 30 mg via INTRAVENOUS
  Administered 2024-02-27: 40 mg via INTRAVENOUS
  Administered 2024-02-27: 30 mg via INTRAVENOUS
  Administered 2024-02-27: 40 mg via INTRAVENOUS

## 2024-02-27 MED ORDER — PHENYLEPHRINE 80 MCG/ML (10ML) SYRINGE FOR IV PUSH (FOR BLOOD PRESSURE SUPPORT)
PREFILLED_SYRINGE | INTRAVENOUS | Status: DC | PRN
  Administered 2024-02-27 (×2): 160 ug via INTRAVENOUS

## 2024-02-27 MED ORDER — SODIUM BICARBONATE 8.4 % IV SOLN
INTRAVENOUS | Status: AC
  Filled 2024-02-27: qty 50

## 2024-02-27 MED ORDER — HYDROXYZINE HCL 25 MG PO TABS
25.0000 mg | ORAL_TABLET | Freq: Four times a day (QID) | ORAL | Status: DC | PRN
  Administered 2024-03-02 – 2024-03-03 (×4): 25 mg via ORAL
  Filled 2024-02-27 (×3): qty 1

## 2024-02-27 MED ORDER — ERYTHROMYCIN 5 MG/GM OP OINT: 1.0000 | TOPICAL_OINTMENT | Freq: Two times a day (BID) | OPHTHALMIC | 0 refills | Status: DC

## 2024-02-27 MED ORDER — HYDRALAZINE HCL 20 MG/ML IJ SOLN
INTRAMUSCULAR | Status: AC
  Filled 2024-02-27: qty 1

## 2024-02-27 MED ORDER — POLYVINYL ALCOHOL 1.4 % OP SOLN
2.0000 [drp] | OPHTHALMIC | Status: DC
  Administered 2024-02-27 – 2024-03-04 (×96): 2 [drp] via OPHTHALMIC
  Filled 2024-02-27: qty 15

## 2024-02-27 MED ORDER — BACITRACIN-POLYMYXIN B 500-10000 UNIT/GM OP OINT
TOPICAL_OINTMENT | OPHTHALMIC | Status: DC | PRN
  Administered 2024-02-27 (×2): 1 via OPHTHALMIC

## 2024-02-27 MED ORDER — OXYCODONE HCL 5 MG PO TABS
2.5000 mg | ORAL_TABLET | Freq: Four times a day (QID) | ORAL | Status: DC | PRN
  Administered 2024-02-27 – 2024-03-04 (×31): 2.5 mg via ORAL
  Filled 2024-02-27 (×16): qty 1

## 2024-02-27 MED ORDER — ALUM & MAG HYDROXIDE-SIMETH 200-200-20 MG/5ML PO SUSP: 30.0000 mL | ORAL | Status: DC | PRN

## 2024-02-27 MED ORDER — FENTANYL CITRATE (PF) 100 MCG/2ML IJ SOLN
25.0000 ug | INTRAMUSCULAR | Status: DC | PRN
  Administered 2024-02-27 (×2): 50 ug via INTRAVENOUS

## 2024-02-27 MED ORDER — OLANZAPINE 10 MG IM SOLR: 5.0000 mg | Freq: Three times a day (TID) | INTRAMUSCULAR | Status: DC | PRN

## 2024-02-27 MED ORDER — ERYTHROMYCIN 5 MG/GM OP OINT
1.0000 | TOPICAL_OINTMENT | Freq: Two times a day (BID) | OPHTHALMIC | Status: DC
  Administered 2024-02-27 – 2024-03-04 (×23): 1 via OPHTHALMIC
  Filled 2024-02-27 (×2): qty 1

## 2024-02-27 MED ORDER — SODIUM BICARBONATE 8.4 % IV SOLN
INTRAVENOUS | Status: DC | PRN
  Administered 2024-02-27 (×2): .5 mL

## 2024-02-27 MED ORDER — ENOXAPARIN SODIUM 40 MG/0.4ML IJ SOSY
40.0000 mg | PREFILLED_SYRINGE | Freq: Every day | INTRAMUSCULAR | Status: DC
  Administered 2024-02-29 – 2024-03-04 (×9): 40 mg via SUBCUTANEOUS
  Filled 2024-02-27 (×7): qty 0.4

## 2024-02-27 MED ORDER — LATANOPROST 0.005 % OP SOLN
1.0000 [drp] | Freq: Every day | OPHTHALMIC | Status: DC
  Administered 2024-02-27 – 2024-03-03 (×12): 1 [drp] via OPHTHALMIC
  Filled 2024-02-27: qty 2.5

## 2024-02-27 MED ORDER — MAGNESIUM HYDROXIDE 400 MG/5ML PO SUSP: 30.0000 mL | Freq: Every day | ORAL | Status: DC | PRN

## 2024-02-27 MED ORDER — LIDOCAINE-EPINEPHRINE 1 %-1:100000 IJ SOLN
INTRAMUSCULAR | Status: DC | PRN
  Administered 2024-02-27 (×2): 2.5 mL

## 2024-02-27 MED ORDER — ADULT MULTIVITAMIN W/MINERALS CH
1.0000 | ORAL_TABLET | Freq: Every day | ORAL | Status: DC
  Administered 2024-02-27 – 2024-03-04 (×13): 1 via ORAL
  Filled 2024-02-27 (×6): qty 1

## 2024-02-27 MED ORDER — FENTANYL CITRATE (PF) 100 MCG/2ML IJ SOLN
INTRAMUSCULAR | Status: AC
  Filled 2024-02-27: qty 2

## 2024-02-27 MED ORDER — ACETAMINOPHEN 500 MG PO TABS
1000.0000 mg | ORAL_TABLET | Freq: Once | ORAL | Status: AC
  Administered 2024-02-27 (×2): 1000 mg via ORAL
  Filled 2024-02-27: qty 2

## 2024-02-27 MED ORDER — TIMOLOL MALEATE 0.5 % OP SOLN
1.0000 [drp] | Freq: Every day | OPHTHALMIC | Status: DC
  Administered 2024-02-28 – 2024-03-04 (×11): 1 [drp] via OPHTHALMIC
  Filled 2024-02-27: qty 5

## 2024-02-27 MED ORDER — 0.9 % SODIUM CHLORIDE (POUR BTL) OPTIME
TOPICAL | Status: DC | PRN
  Administered 2024-02-27 (×2): 1000 mL

## 2024-02-27 MED ORDER — ENOXAPARIN SODIUM 30 MG/0.3ML IJ SOSY: 30.0000 mg | PREFILLED_SYRINGE | Freq: Two times a day (BID) | INTRAMUSCULAR | 0 refills | Status: AC

## 2024-02-27 MED ORDER — ACETAMINOPHEN 325 MG PO TABS: 650.0000 mg | ORAL_TABLET | Freq: Four times a day (QID) | ORAL | 0 refills | Status: AC | PRN

## 2024-02-27 MED ORDER — ONDANSETRON HCL 4 MG/2ML IJ SOLN
INTRAMUSCULAR | Status: AC
  Filled 2024-02-27: qty 2

## 2024-02-27 MED ORDER — CEFAZOLIN SODIUM-DEXTROSE 2-4 GM/100ML-% IV SOLN
2.0000 g | INTRAVENOUS | Status: AC
  Administered 2024-02-27 (×2): 2 g via INTRAVENOUS
  Filled 2024-02-27: qty 100

## 2024-02-27 MED ORDER — DOXAZOSIN MESYLATE 1 MG PO TABS
1.0000 mg | ORAL_TABLET | Freq: Every day | ORAL | Status: DC
  Administered 2024-02-27 – 2024-03-04 (×13): 1 mg via ORAL
  Filled 2024-02-27 (×7): qty 1

## 2024-02-27 MED ORDER — TETRACAINE HCL 0.5 % OP SOLN
OPHTHALMIC | Status: DC | PRN
  Administered 2024-02-27 (×2): 1 [drp] via OPHTHALMIC

## 2024-02-27 MED ORDER — ATORVASTATIN CALCIUM 10 MG PO TABS
20.0000 mg | ORAL_TABLET | Freq: Every day | ORAL | Status: DC
  Administered 2024-02-27 – 2024-03-04 (×13): 20 mg via ORAL
  Filled 2024-02-27 (×7): qty 2

## 2024-02-27 MED ORDER — ACETAMINOPHEN 325 MG PO TABS: 650.0000 mg | ORAL_TABLET | Freq: Four times a day (QID) | ORAL | Status: DC | PRN

## 2024-02-27 MED ORDER — ONDANSETRON HCL 4 MG/2ML IJ SOLN
INTRAMUSCULAR | Status: DC | PRN
  Administered 2024-02-27 (×2): 4 mg via INTRAVENOUS

## 2024-02-27 MED ORDER — PROPOFOL 500 MG/50ML IV EMUL
INTRAVENOUS | Status: DC | PRN
  Administered 2024-02-27 (×2): 125 ug/kg/min via INTRAVENOUS

## 2024-02-27 MED ORDER — ONDANSETRON HCL 4 MG/2ML IJ SOLN: 4.0000 mg | Freq: Once | INTRAMUSCULAR | Status: DC | PRN

## 2024-02-27 MED ORDER — ORAL CARE MOUTH RINSE: 15.0000 mL | Freq: Once | OROMUCOSAL | Status: AC

## 2024-02-27 MED ORDER — FOOD THICKENER (SIMPLYTHICK): 1.0000 | ORAL | Status: DC | PRN

## 2024-02-27 MED ORDER — HYDRALAZINE HCL 20 MG/ML IJ SOLN
10.0000 mg | Freq: Once | INTRAMUSCULAR | Status: AC
  Administered 2024-02-27 (×2): 10 mg via INTRAVENOUS

## 2024-02-27 MED ORDER — OLANZAPINE 5 MG PO TBDP: 5.0000 mg | ORAL_TABLET | Freq: Three times a day (TID) | ORAL | Status: DC | PRN

## 2024-02-27 MED ORDER — TRAZODONE HCL 50 MG PO TABS
50.0000 mg | ORAL_TABLET | Freq: Every evening | ORAL | Status: DC | PRN
  Administered 2024-03-02 – 2024-03-03 (×6): 50 mg via ORAL
  Filled 2024-02-27 (×4): qty 1

## 2024-02-27 MED ORDER — ARTIFICIAL TEARS OPHTHALMIC OINT
TOPICAL_OINTMENT | Freq: Every day | OPHTHALMIC | Status: DC
  Filled 2024-02-27: qty 1

## 2024-02-27 MED ORDER — RAMELTEON 8 MG PO TABS
8.0000 mg | ORAL_TABLET | Freq: Every day | ORAL | Status: DC
  Administered 2024-02-27 – 2024-03-03 (×12): 8 mg via ORAL
  Filled 2024-02-27 (×6): qty 1

## 2024-02-27 MED ORDER — CHLORHEXIDINE GLUCONATE 0.12 % MT SOLN
15.0000 mL | Freq: Once | OROMUCOSAL | Status: AC
  Administered 2024-02-27 (×2): 15 mL via OROMUCOSAL
  Filled 2024-02-27: qty 15

## 2024-02-27 MED ORDER — VENLAFAXINE HCL ER 37.5 MG PO CP24
37.5000 mg | ORAL_CAPSULE | Freq: Every day | ORAL | Status: DC
  Administered 2024-02-28 – 2024-03-03 (×10): 37.5 mg via ORAL
  Filled 2024-02-27 (×5): qty 1

## 2024-02-27 MED ORDER — ENSURE PLUS HIGH PROTEIN PO LIQD
237.0000 mL | Freq: Three times a day (TID) | ORAL | Status: DC
Start: 2024-02-27 — End: 2024-03-04
  Administered 2024-02-27 – 2024-03-04 (×31): 237 mL via ORAL

## 2024-02-27 MED ORDER — LACTATED RINGERS IV SOLN: INTRAVENOUS | Status: DC

## 2024-02-27 MED ORDER — LIDOCAINE-EPINEPHRINE 1 %-1:100000 IJ SOLN
INTRAMUSCULAR | Status: AC
  Filled 2024-02-27: qty 1

## 2024-02-27 SURGICAL SUPPLY — 39 items
BAG COUNTER SPONGE SURGICOUNT (BAG) ×1 IMPLANT
BETADINE 5% OPHTHALMIC (OPHTHALMIC) ×2 IMPLANT
BLADE SURG 15 STRL LF DISP TIS (BLADE) ×1 IMPLANT
CLEANER TIP ELECTROSURG 2X2 (MISCELLANEOUS) ×1 IMPLANT
CNTNR URN SCR LID CUP LEK RST (MISCELLANEOUS) IMPLANT
CORD BIPOLAR FORCEPS 12FT (ELECTRODE) IMPLANT
COVER SURGICAL LIGHT HANDLE (MISCELLANEOUS) ×1 IMPLANT
DRAPE HALF SHEET 40X57 (DRAPES) ×1 IMPLANT
DRSG TEGADERM 2-3/8X2-3/4 SM (GAUZE/BANDAGES/DRESSINGS) ×2 IMPLANT
DRSG TELFA 3X8 NADH STRL (GAUZE/BANDAGES/DRESSINGS) IMPLANT
ELECT COATED BLADE 2.86 ST (ELECTRODE) IMPLANT
ELECTRODE COLORADO STRAIGHT 4 (ELECTROSURGICAL) IMPLANT
ELECTRODE NDL INSULATED 6.5 (ELECTROSURGICAL) ×1 IMPLANT
ELECTRODE REM PT RTRN 9FT ADLT (ELECTROSURGICAL) ×1 IMPLANT
FORCEPS BIPOLAR SPETZLER 8 1.0 (NEUROSURGERY SUPPLIES) IMPLANT
GAUZE 4X4 16PLY ~~LOC~~+RFID DBL (SPONGE) IMPLANT
GAUZE XEROFORM 1X8 LF (GAUZE/BANDAGES/DRESSINGS) IMPLANT
GLOVE BIO SURGEON STRL SZ7.5 (GLOVE) ×1 IMPLANT
GLOVE BIOGEL PI IND STRL 8 (GLOVE) ×1 IMPLANT
GOWN STRL REUS W/ TWL LRG LVL3 (GOWN DISPOSABLE) ×1 IMPLANT
GOWN STRL REUS W/ TWL XL LVL3 (GOWN DISPOSABLE) ×1 IMPLANT
IMPLANT EYELID PLATINUM 6X17.1 (Ophthalmic Related) IMPLANT
KIT BASIN OR (CUSTOM PROCEDURE TRAY) ×1 IMPLANT
KIT TURNOVER KIT B (KITS) ×1 IMPLANT
MARKER SKIN DUAL TIP RULER LAB (MISCELLANEOUS) IMPLANT
NDL PRECISIONGLIDE 27X1.5 (NEEDLE) ×1 IMPLANT
NEEDLE PRECISIONGLIDE 27X1.5 (NEEDLE) ×1 IMPLANT
PAD ARMBOARD POSITIONER FOAM (MISCELLANEOUS) ×2 IMPLANT
PENCIL SMOKE EVACUATOR (MISCELLANEOUS) ×1 IMPLANT
SOLN 0.9% NACL 1000 ML (IV SOLUTION) IMPLANT
SOLN 0.9% NACL POUR BTL 1000ML (IV SOLUTION) ×1 IMPLANT
SUT ETHILON 2 0 FS 18 (SUTURE) IMPLANT
SUT MERSILENE 4 0 P 3 (SUTURE) IMPLANT
SUT PLAIN GUT FAST 5-0 (SUTURE) IMPLANT
SUT VIC AB 4-0 PS2 18 (SUTURE) IMPLANT
SUT VICRYL 6 0 S 14 UNDY (SUTURE) IMPLANT
SYR CONTROL 10ML LL (SYRINGE) IMPLANT
TOWEL GREEN STERILE (TOWEL DISPOSABLE) ×1 IMPLANT
TRAY ENT MC OR (CUSTOM PROCEDURE TRAY) ×1 IMPLANT
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
## 2024-02-27 NOTE — Progress Notes (Signed)
 [facility]--[phone]  [facility]--[phone]

## 2024-02-27 NOTE — Anesthesia Procedure Notes (Signed)
 Procedure Name: MAC Date/Time: 02/27/2024 2:21 PM  Performed by: Emmitt Millman, CRNAPre-anesthesia Checklist: Patient identified, Emergency Drugs available, Suction available and Patient being monitored Patient Re-evaluated:Patient Re-evaluated prior to induction Oxygen Delivery Method: Nasal cannula Preoxygenation: Pre-oxygenation with 100% oxygen Induction Type: IV induction
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
## 2024-02-27 NOTE — Transfer of Care (Signed)
 Immediate Anesthesia Transfer of Care Note  Patient: [name] [name]  Procedure(s) Performed: REPAIR, ECTROPION, EYELID (Left: Eye)  Patient Location: PACU  Anesthesia Type:MAC  Level of Consciousness: drowsy  Airway & Oxygen Therapy: Patient Spontanous Breathing and Patient connected to nasal cannula oxygen  Post-op Assessment: Report given to RN and Post -op Vital signs reviewed and stable  Post vital signs: Reviewed and stable  Last Vitals:  Vitals Value Taken Time  BP 141/82 02/27/24 15:09  Temp    Pulse 68 02/27/24 15:11  Resp    SpO2 94 % 02/27/24 15:11  Vitals shown include unfiled device data.  Last Pain:  Vitals:   02/27/24 1209  PainSc: 8          Complications: No notable events documented.

## 2024-02-27 NOTE — BHH Suicide Risk Assessment (Signed)
 [facility] Discharge Suicide Risk Assessment   Principal Problem: Major depressive disorder, recurrent severe without psychotic features (HCC) Discharge Diagnoses: Principal Problem:   Major depressive disorder, recurrent severe without psychotic features (HCC)   Total Time spent with patient: 30 minutes  Musculoskeletal: Strength & Muscle Tone: decreased Gait & Station: unsteady Patient leans: N/A  Psychiatric Specialty Exam  Presentation  General Appearance:  Appropriate for Environment; Casual  Eye Contact: Fair  Speech: Normal Rate  Speech Volume: Decreased  Handedness: Right   Mood and Affect  Mood: Depressed  Duration of Depression Symptoms: No data recorded Affect: Depressed; Flat   Thought Process  Thought Processes: Coherent  Descriptions of Associations:Intact  Orientation:Full (Time, Place and Person)  Thought Content:Logical  History of Schizophrenia/Schizoaffective disorder:No data recorded Duration of Psychotic Symptoms:No data recorded Hallucinations:No data recorded Ideas of Reference:None  Suicidal Thoughts:No data recorded Homicidal Thoughts:No data recorded  Sensorium  Memory: Immediate Fair; Recent Fair; Remote Poor  Judgment: Impaired  Insight: Shallow   Executive Functions  Concentration: Fair  Attention Span: Fair  Recall: [employer] of Knowledge: Fair  Language: Fair   Psychomotor Activity  Psychomotor Activity:No data recorded  Assets  Assets: Communication Skills; Desire for Improvement   Sleep  Sleep:No data recorded Estimated Sleeping Duration (Last 24 Hours): 9.25-11.75 hours  Physical Exam: Physical Exam ROS Blood pressure (!) 142/80, pulse 72, temperature 98.1 F (36.7 C), resp. rate 18, height 5' 6 (1.676 m), weight 72.6 kg, SpO2 95%. Body mass index is 25.82 kg/m.  Mental Status Per Nursing Assessment::   On Admission:  Self-harm behaviors  Demographic Factors:  Male  Loss  Factors: Decrease in vocational status  Historical Factors: Impulsivity  Risk Reduction Factors:   Living with another person, especially a relative, Positive social support, Positive therapeutic relationship, and Positive coping skills or problem solving skills  Continued Clinical Symptoms:  Depression:   Impulsivity  Cognitive Features That Contribute To Risk:  None    Suicide Risk:  Moderate:  Frequent suicidal ideation with limited intensity, and duration, some specificity in terms of plans, no associated intent, good self-control, limited dysphoria/symptomatology, some risk factors present, and identifiable protective factors, including available and accessible social support.    Plan Of Care/Follow-up recommendations:  Activity:  As tolerated  Allyn Foil, MD 02/27/2024, 9:21 AM

## 2024-02-27 NOTE — Progress Notes (Signed)
 Patient has been escorted to safe transport for an outpatient procedure at [facility]. Patient will be returning to the gero psych unit once the procedure has concluded.

## 2024-02-27 NOTE — Discharge Instructions (Addendum)
 FACIAL PLASTIC SURGERY POSTOP INSTRUCTIONS  Use ice packs liberally the next 3 days; 20 minutes on/20 minutes off.  Continue eye drops for eye moisture Use medicated eye ointment as directed RTC in 7-10 days for wound check   Elspeth Coddington MD [facility] [place] [phone] [place], [place] [address] Phone: [phone]
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
## 2024-02-27 NOTE — Progress Notes (Signed)
 Alert and oriented. Flat affect. Isolates in room. Hibiclens  provided for Shower. NPO after midnight for preparation for surgery this morning. Safety transported notified for pick up at 11:45 a. Per physician order, pt to report to [facility] 12:15pm. Po med compliant. PRN given for pain mgmt. Tol well. No behavior issues noted. Denies SI/HI/AVH. No further complaints offered.    02/26/24 2100  Psych Admission Type (Psych Patients Only)  Admission Status Voluntary  Psychosocial Assessment  Patient Complaints Depression  Eye Contact Brief  Facial Expression Flat  Affect Appropriate to circumstance  Speech Soft  Interaction Isolative  Motor Activity Slow  Appearance/Hygiene In scrubs  Behavior Characteristics Cooperative  Mood Sad  Thought Process  Coherency WDL  Content WDL  Delusions None reported or observed  Perception WDL  Hallucination None reported or observed  Judgment Impaired  Confusion None  Danger to Self  Current suicidal ideation? Denies

## 2024-02-27 NOTE — Progress Notes (Signed)
 Per Garen, case Production designer, theatre/television/film at [employer], psychiatrist said it is okay for patient to transport in SAFE transport with no sitter back to receiving facility. Report called to [facility], Sports coach.
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
## 2024-02-27 NOTE — H&P (Signed)
 [name] [name] is an 89 y.o. male.    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  He was transported from [facility] inpatient psychiatry.   Past Medical History:  Diagnosis Date   BPH (benign prostatic hyperplasia)    Coronary artery disease    Depression    HLD (hyperlipidemia)    Hypertension    Moderate depressed bipolar disorder (HCC)    Prostate CA (HCC)    s/p radioactive prostate seed implants/TURP   Sensorineural hearing loss (SNHL) of both ears    Urethral stricture     Past Surgical History:  Procedure Laterality Date   FACIAL LACERATION REPAIR Left 01/30/2024   Procedure: REPAIR, LACERATION, FACE;  Surgeon: Luciano Standing, MD;  Location: [facility] OR;  Service: ENT;  Laterality: Left;   ORIF MANDIBULAR FRACTURE Left 01/30/2024   Procedure: OPEN REDUCTION INTERNAL FIXATION (ORIF) MANDIBULAR FRACTURE;  Surgeon: Luciano Standing, MD;  Location: [facility] OR;  Service: ENT;  Laterality: Left;   TRANSURETHRAL RESECTION OF PROSTATE  2012    History reviewed. No pertinent family history.  Social History:  reports that he has never smoked. He has never used smokeless tobacco. He reports that he does not use drugs. No history on file for alcohol  use.  Allergies: No Known Allergies  Medications Prior to Admission  Medication Sig Dispense Refill   acetaminophen  (TYLENOL ) 325 MG tablet Take 2 tablets (650 mg total) by mouth every 6 (six) hours as needed for mild pain (pain score 1-3). 30 tablet 0   artificial tears (LACRILUBE) OINT ophthalmic ointment Place into the left eye at bedtime.     artificial tears ophthalmic solution Place 2 drops into the left eye every hour while awake.     atorvastatin  (LIPITOR) 20 MG tablet Take 20 mg by mouth daily.     bacitracin  ointment Apply topically 3 (three) times daily.     doxazosin  (CARDURA ) 1 MG tablet Take 1 tablet (1 mg total) by mouth  daily.     enoxaparin  (LOVENOX ) 30 MG/0.3ML injection Inject 0.3 mLs (30 mg total) into the skin every 12 (twelve) hours. 18 mL 0   feeding supplement (ENSURE PLUS HIGH PROTEIN) LIQD Take 237 mLs by mouth 3 (three) times daily between meals.     latanoprost  (XALATAN ) 0.005 % ophthalmic solution Place 1 drop into both eyes at bedtime. 2.5 mL 12   loperamide  (IMODIUM ) 2 MG capsule Take 1 capsule (2 mg total) by mouth 2 (two) times daily.     Multiple Vitamin (MULTIVITAMIN WITH MINERALS) TABS tablet Take 1 tablet by mouth daily.     oxyCODONE  (OXY IR/ROXICODONE ) 5 MG immediate release tablet Take 0.5 tablets (2.5 mg total) by mouth every 6 (six) hours as needed for severe pain (pain score 7-10). 10 tablet 0   ramelteon  (ROZEREM ) 8 MG tablet Take 1 tablet (8 mg total) by mouth at bedtime.     sertraline  (ZOLOFT ) 100 MG tablet Take 1 tablet (100 mg total) by mouth daily.     timolol  (TIMOPTIC ) 0.5 % ophthalmic solution Place 1 drop into both eyes daily.      No results found for this or any previous visit (from the past 48 hours). No results found.  ROS: negative other than stated in HPI  There were no vitals taken for this visit.  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  Proceed with left lower eyelid ectropion repair and left upper eyelid platinum weight insertion. Informed consent obtained. RBA discussed.     Electronically signed by:  Elspeth Coddington, MD  Facial Plastic & Reconstructive Surgery Otolaryngology - Head and Neck Surgery [facility]  02/27/2024, 12:22 PM

## 2024-02-28 ENCOUNTER — Encounter (HOSPITAL_COMMUNITY): Payer: Self-pay | Admitting: Otolaryngology

## 2024-02-28 DIAGNOSIS — F332 Major depressive disorder, recurrent severe without psychotic features: Secondary | ICD-10-CM | POA: Diagnosis not present

## 2024-02-28 LAB — HEMOGLOBIN A1C
Hgb A1c MFr Bld: 4.4 % — ABNORMAL LOW (ref 4.8–5.6)
Mean Plasma Glucose: 79.58 mg/dL

## 2024-02-28 LAB — LIPID PANEL
Cholesterol: 182 mg/dL (ref 0–200)
HDL: 50 mg/dL (ref >40)
LDL Cholesterol: 114 mg/dL — ABNORMAL HIGH (ref 0–99)
Total CHOL/HDL Ratio: 3.6 ratio
Triglycerides: 92 mg/dL (ref <150)
VLDL: 18 mg/dL (ref 0–40)
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
## 2024-02-28 NOTE — Group Note (Signed)
 Date:  02/28/2024 Time:  8:45 PM  Group Topic/Focus:  Wrap-Up Group:   The focus of this group is to help patients review their daily goal of treatment and discuss progress on daily workbooks.    Participation Level:  Did Not Attend  Beatris ONEIDA Hasten 02/28/2024, 8:45 PM

## 2024-02-28 NOTE — Group Note (Signed)
 LCSW Group Therapy Note  Group Date: 02/28/2024 Start Time: 1330 End Time: 1400   Type of Therapy and Topic:  Group Therapy - Healthy vs Unhealthy Coping Skills  Participation Level:  Did Not Attend   Description of Group The focus of this group was to determine what unhealthy coping techniques typically are used by group members and what healthy coping techniques would be helpful in coping with various problems. Patients were guided in becoming aware of the differences between healthy and unhealthy coping techniques. Patients were asked to identify 2-3 healthy coping skills they would like to learn to use more effectively.  Therapeutic Goals Patients learned that coping is what human beings do all day long to deal with various situations in their lives Patients defined and discussed healthy vs unhealthy coping techniques Patients identified their preferred coping techniques and identified whether these were healthy or unhealthy Patients determined 2-3 healthy coping skills they would like to become more familiar with and use more often. Patients provided support and ideas to each other   Summary of Patient Progress: X  Therapeutic Modalities Cognitive Behavioral Therapy Motivational Interviewing  Lum JONETTA Croft, [place] 02/28/2024  2:32 PM

## 2024-02-28 NOTE — Progress Notes (Signed)
   02/28/24 0701  Psych Admission Type (Psych Patients Only)  Admission Status Voluntary  Psychosocial Assessment  Patient Complaints Depression  Eye Contact Brief  Facial Expression Flat  Affect Flat  Speech Soft  Interaction Isolative  Motor Activity Slow  Appearance/Hygiene In scrubs  Behavior Characteristics Cooperative  Mood Sullen  Thought Process  Coherency WDL  Content WDL  Delusions None reported or observed  Perception WDL  Hallucination None reported or observed  Judgment Impaired  Confusion None  Danger to Self  Current suicidal ideation? Denies  Danger to Others  Danger to Others None reported or observed
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
## 2024-02-28 NOTE — BHH Counselor (Signed)
 CSW contacted [place] Gales, son per request of nursing staff who reports [place] attempted to contact CSW twice.   CSW unable to reach, left HIPAA compliant VM requesting return call.  Lum Croft, MSW, [place] 02/28/2024 4:07 PM

## 2024-02-28 NOTE — Plan of Care (Signed)
  Problem: Education: Goal: Ability to make informed decisions regarding treatment will improve Outcome: Progressing   Problem: Coping: Goal: Coping ability will improve Outcome: Progressing   Problem: Health Behavior/Discharge Planning: Goal: Identification of resources available to assist in meeting health care needs will improve Outcome: Progressing   Problem: Medication: Goal: Compliance with prescribed medication regimen will improve Outcome: Progressing   Problem: Self-Concept: Goal: Ability to disclose and discuss suicidal ideas will improve Outcome: Progressing   

## 2024-02-28 NOTE — BHH Counselor (Incomplete)
 CSW received note from the nurse that pt's son [place] had called back.

## 2024-02-28 NOTE — Anesthesia Postprocedure Evaluation (Signed)
 Anesthesia Post Note  Patient: [name] [name]  Procedure(s) Performed: REPAIR, ECTROPION, EYELID (Left: Eye)     Patient location during evaluation: PACU Anesthesia Type: MAC Level of consciousness: awake and alert Pain management: pain level controlled Vital Signs Assessment: post-procedure vital signs reviewed and stable Respiratory status: spontaneous breathing, nonlabored ventilation and respiratory function stable Cardiovascular status: blood pressure returned to baseline and stable Postop Assessment: no apparent nausea or vomiting Anesthetic complications: no   No notable events documented.  Last Vitals:  Vitals:   02/27/24 1600 02/27/24 1615  BP: (!) 193/85 (!) 183/94  Pulse: 61 (!) 58  Resp: 14 14  Temp:  36.7 C  SpO2: 93% 94%    Last Pain:  Vitals:   02/27/24 1510  PainSc: Asleep   Pain Goal:                   Butler Levander Pinal

## 2024-02-28 NOTE — Progress Notes (Signed)
 Alert and oriented. Flat affect. Isolative. C/o left eye pain. Oxy 2.5 mg po given PRN at 2157pm and 0330 am for pain/discomfort. Denies SI/HI/AVH. Q 15 min checks maintained for safety.    02/27/24 2100  Psych Admission Type (Psych Patients Only)  Admission Status Voluntary  Psychosocial Assessment  Patient Complaints Depression  Eye Contact Brief  Facial Expression Flat  Affect Flat  Speech Soft  Interaction Isolative  Motor Activity Slow  Appearance/Hygiene In scrubs  Behavior Characteristics Cooperative  Mood Sad  Thought Process  Coherency WDL  Content WDL  Delusions None reported or observed  Perception WDL  Hallucination None reported or observed  Judgment Impaired  Confusion None  Danger to Self  Current suicidal ideation? Denies

## 2024-02-28 NOTE — Group Note (Signed)
 Recreation Therapy Group Note   Group Topic:Communication  Group Date: 02/28/2024 Start Time: 1400 End Time: 1500 Facilitators: Celestia Jeoffrey BRAVO, LRT, CTRS Location: Courtyard  Group Description: Merchant navy officer. Patients drew a laminated card out of a bag that had a word or phrase on it. Pt encouraged to speak about a time in their life or fond memory that specifically relates to the word they chose out of the bag. An example would be: "parenthood, meals, siblings, travel, or home".  LRT prompted following questions and encouraged contribution from peers to increase communication.   Goal Area(s) Addressed: Patient will increase verbal communication by conversing with peers. Patient will contribute to group discussion with minimal prompting. Patient will reminisce a positive memory or moment in their life.   Affect/Mood: N/A   Participation Level: Did not attend    Clinical Observations/Individualized Feedback: Patient did not attend group.   Plan: Continue to engage patient in RT group sessions 2-3x/week.   Jeoffrey BRAVO Celestia, LRT, CTRS 02/28/2024 4:58 PM

## 2024-02-29 DIAGNOSIS — F332 Major depressive disorder, recurrent severe without psychotic features: Secondary | ICD-10-CM | POA: Diagnosis not present

## 2024-02-29 NOTE — Group Note (Signed)
 Physical/Occupational Therapy Group Note  Group Topic: Pain Management and Coping   Group Date: 02/29/2024 Start Time: 1310 End Time: 1405 Facilitators: Clive Warren CROME, OT   Group Description:  Group discussed impact of chronic/acute pain on safety and independence with functional tasks and impact on mental health.  Identified and discussed any previously learned or implemented strategies used.  Discussed and reviewed cognitive behavioral pain coping strategies to address/improve overall management of pain. Discussed relaxation, distraction techniques, cognitive restructuring, activity pacing/energy conservation, environment/home safety modifications, and role of sleep and sleep hygiene. Allowed time for questions and further discussion.      Therapeutic Goal(s):   Identify and discuss previously utilized pain coping strategies and implications of pain on function/well-being Identify and discuss implementing new cognitive behavioral pain coping strategies into daily routines Demonstrate understanding and performance of learned cognitive behavioral pain coping strategies.    Individual Participation: Did not attend.   Participation Level: Did not attend   Participation Quality:   Behavior:   Speech/Thought Process:   Affect/Mood:   Insight:   Judgement:   Modes of Intervention:   Patient Response to Interventions:    Plan: Continue to engage patient in PT/OT groups 1 - 2x/week.  Kaysia Willard R., MPH, MS, OTR/L ascom [phone] 02/29/24, 3:48 PM
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
## 2024-02-29 NOTE — Plan of Care (Signed)
  Problem: Education: Goal: Ability to make informed decisions regarding treatment will improve Outcome: Progressing   Problem: Coping: Goal: Coping ability will improve Outcome: Progressing   Problem: Health Behavior/Discharge Planning: Goal: Identification of resources available to assist in meeting health care needs will improve Outcome: Progressing   Problem: Medication: Goal: Compliance with prescribed medication regimen will improve Outcome: Progressing   Problem: Self-Concept: Goal: Ability to disclose and discuss suicidal ideas will improve Outcome: Progressing Goal: Will verbalize positive feelings about self Outcome: Progressing Note: Patient is on track. Patient will maintain adherence, avoid flare triggers, and be evaluated at upcoming provider appointment to assess progress    Problem: Education: Goal: Knowledge of [place] [facility] Education information/materials will improve Outcome: Progressing Goal: Emotional status will improve Outcome: Progressing Goal: Mental status will improve Outcome: Progressing Goal: Verbalization of understanding the information provided will improve Outcome: Progressing   Problem: Activity: Goal: Interest or engagement in activities will improve Outcome: Progressing Goal: Sleeping patterns will improve Outcome: Progressing   Problem: Coping: Goal: Ability to verbalize frustrations and anger appropriately will improve Outcome: Progressing Goal: Ability to demonstrate self-control will improve Outcome: Progressing   Problem: Health Behavior/Discharge Planning: Goal: Identification of resources available to assist in meeting health care needs will improve Outcome: Progressing Goal: Compliance with treatment plan for underlying cause of condition will improve Outcome: Progressing   Problem: Physical Regulation: Goal: Ability to maintain clinical measurements within normal limits will improve Outcome: Progressing    Problem: Safety: Goal: Periods of time without injury will increase Outcome: Progressing   Problem: Education: Goal: Utilization of techniques to improve thought processes will improve Outcome: Progressing Goal: Knowledge of the prescribed therapeutic regimen will improve Outcome: Progressing   Problem: Activity: Goal: Interest or engagement in leisure activities will improve Outcome: Progressing Goal: Imbalance in normal sleep/wake cycle will improve Outcome: Progressing   Problem: Coping: Goal: Coping ability will improve Outcome: Progressing Goal: Will verbalize feelings Outcome: Progressing   Problem: Health Behavior/Discharge Planning: Goal: Ability to make decisions will improve Outcome: Progressing Goal: Compliance with therapeutic regimen will improve Outcome: Progressing   Problem: Role Relationship: Goal: Will demonstrate positive changes in social behaviors and relationships Outcome: Progressing   Problem: Safety: Goal: Ability to disclose and discuss suicidal ideas will improve Outcome: Progressing Goal: Ability to identify and utilize support systems that promote safety will improve Outcome: Progressing   Problem: Self-Concept: Goal: Will verbalize positive feelings about self Outcome: Progressing Goal: Level of anxiety will decrease Outcome: Progressing   Problem: Education: Goal: Ability to state activities that reduce stress will improve Outcome: Progressing   Problem: Coping: Goal: Ability to identify and develop effective coping behavior will improve Outcome: Progressing   Problem: Self-Concept: Goal: Ability to identify factors that promote anxiety will improve Outcome: Progressing Goal: Level of anxiety will decrease Outcome: Progressing Goal: Ability to modify response to factors that promote anxiety will improve Outcome: Progressing

## 2024-02-29 NOTE — Progress Notes (Signed)
   02/29/24 2100  Psych Admission Type (Psych Patients Only)  Admission Status Voluntary  Psychosocial Assessment  Patient Complaints None  Eye Contact Brief  Facial Expression Flat  Affect Flat  Speech Soft  Interaction Isolative  Motor Activity Slow  Appearance/Hygiene In scrubs  Behavior Characteristics Appropriate to situation;Cooperative  Mood Sullen  Thought Process  Coherency WDL  Content WDL  Delusions None reported or observed  Perception WDL  Hallucination None reported or observed  Judgment Impaired  Confusion None  Danger to Self  Current suicidal ideation? Denies  Danger to Others  Danger to Others None reported or observed

## 2024-02-29 NOTE — Group Note (Signed)
 Recreation Therapy Group Note   Group Topic:Stress Management  Group Date: 02/29/2024 Start Time: 1400 End Time: 1500 Facilitators: Celestia Jeoffrey BRAVO, LRT, CTRS Location: Courtyard  Group Description: Meditation. LRT and patients discussed what they know about meditation and mindfulness. LRT played a Deep Breathing Meditation exercise script for patients to follow along to. LRT and patients discussed how meditation and deep breathing can be used as a coping skill post--discharge to help manage symptoms of stress. With remaining time in group, LRT played music through a speaker at the patients request of songs.   Goal Area(s) Addressed: Patient will practice using relaxation technique. Patient will identify a new coping skill.  Patient will follow multistep directions to reduce anxiety and stress.   Affect/Mood: N/A   Participation Level: Did not attend    Clinical Observations/Individualized Feedback: Patient did not attend group.   Plan: Continue to engage patient in RT group sessions 2-3x/week.   Jeoffrey BRAVO Celestia, LRT, CTRS 02/29/2024 3:40 PM

## 2024-02-29 NOTE — Progress Notes (Signed)
   02/28/24 2100  Psych Admission Type (Psych Patients Only)  Admission Status Voluntary  Psychosocial Assessment  Patient Complaints None  Eye Contact Brief  Facial Expression Flat  Affect Flat  Speech Soft  Interaction Isolative  Motor Activity Slow  Appearance/Hygiene In scrubs  Behavior Characteristics Cooperative;Appropriate to situation  Mood Sullen  Thought Process  Coherency WDL  Content WDL  Delusions None reported or observed  Perception WDL  Hallucination None reported or observed  Judgment Impaired  Confusion None  Danger to Self  Current suicidal ideation? Denies  Danger to Others  Danger to Others None reported or observed

## 2024-02-29 NOTE — Plan of Care (Signed)
  Problem: Education: Goal: Ability to make informed decisions regarding treatment will improve Outcome: Progressing   Problem: Coping: Goal: Coping ability will improve Outcome: Progressing   Problem: Health Behavior/Discharge Planning: Goal: Identification of resources available to assist in meeting health care needs will improve Outcome: Progressing   Problem: Medication: Goal: Compliance with prescribed medication regimen will improve Outcome: Progressing   Problem: Self-Concept: Goal: Ability to disclose and discuss suicidal ideas will improve Outcome: Progressing Goal: Will verbalize positive feelings about self Outcome: Progressing Note: Patient is on track. Patient will maintain adherence and be monitored by provider to determine if a change in treatment plan is warranted    Problem: Education: Goal: Knowledge of [facility] Education information/materials will improve Outcome: Progressing Goal: Emotional status will improve Outcome: Progressing Goal: Mental status will improve Outcome: Progressing Goal: Verbalization of understanding the information provided will improve Outcome: Progressing   Problem: Activity: Goal: Interest or engagement in activities will improve Outcome: Progressing Goal: Sleeping patterns will improve Outcome: Progressing   Problem: Coping: Goal: Ability to verbalize frustrations and anger appropriately will improve Outcome: Progressing Goal: Ability to demonstrate self-control will improve Outcome: Progressing   Problem: Health Behavior/Discharge Planning: Goal: Identification of resources available to assist in meeting health care needs will improve Outcome: Progressing Goal: Compliance with treatment plan for underlying cause of condition will improve Outcome: Progressing   Problem: Physical Regulation: Goal: Ability to maintain clinical measurements within normal limits will improve Outcome: Progressing   Problem:  Safety: Goal: Periods of time without injury will increase Outcome: Progressing   Problem: Education: Goal: Utilization of techniques to improve thought processes will improve Outcome: Progressing Goal: Knowledge of the prescribed therapeutic regimen will improve Outcome: Progressing   Problem: Activity: Goal: Interest or engagement in leisure activities will improve Outcome: Progressing Goal: Imbalance in normal sleep/wake cycle will improve Outcome: Progressing   Problem: Coping: Goal: Coping ability will improve Outcome: Progressing Goal: Will verbalize feelings Outcome: Progressing   Problem: Health Behavior/Discharge Planning: Goal: Ability to make decisions will improve Outcome: Progressing Goal: Compliance with therapeutic regimen will improve Outcome: Progressing   Problem: Role Relationship: Goal: Will demonstrate positive changes in social behaviors and relationships Outcome: Progressing   Problem: Safety: Goal: Ability to disclose and discuss suicidal ideas will improve Outcome: Progressing Goal: Ability to identify and utilize support systems that promote safety will improve Outcome: Progressing   Problem: Self-Concept: Goal: Will verbalize positive feelings about self Outcome: Progressing Goal: Level of anxiety will decrease Outcome: Progressing   Problem: Education: Goal: Ability to state activities that reduce stress will improve Outcome: Progressing   Problem: Coping: Goal: Ability to identify and develop effective coping behavior will improve Outcome: Progressing   Problem: Self-Concept: Goal: Ability to identify factors that promote anxiety will improve Outcome: Progressing Goal: Level of anxiety will decrease Outcome: Progressing Goal: Ability to modify response to factors that promote anxiety will improve Outcome: Progressing

## 2024-03-01 DIAGNOSIS — F332 Major depressive disorder, recurrent severe without psychotic features: Secondary | ICD-10-CM | POA: Diagnosis not present

## 2024-03-01 NOTE — Group Note (Signed)
 [facility] LCSW Group Therapy Note   Group Date: 03/01/2024 Start Time: 1330 End Time: 1415   Type of Therapy/Topic:  Group Therapy:  Emotion Regulation Did Not Attend Participation Level:     Mood:  Description of Group:    The purpose of this group is to assist patients in learning to regulate negative emotions and experience positive emotions. Patients will be guided to discuss ways in which they have been vulnerable to their negative emotions. These vulnerabilities will be juxtaposed with experiences of positive emotions or situations, and patients challenged to use positive emotions to combat negative ones. Special emphasis will be placed on coping with negative emotions in conflict situations, and patients will process healthy conflict resolution skills.  Therapeutic Goals: Patient will identify two positive emotions or experiences to reflect on in order to balance out negative emotions:  Patient will label two or more emotions that they find the most difficult to experience:  Patient will be able to demonstrate positive conflict resolution skills through discussion or role plays:   Summary of Patient Progress:   Did Not Attend    Therapeutic Modalities:   Cognitive Behavioral Therapy Feelings Identification Dialectical Behavioral Therapy   Aldo CHRISTELLA Niece, LCSW

## 2024-03-01 NOTE — Plan of Care (Signed)
  Problem: Coping: Goal: Coping ability will improve Outcome: Progressing   Problem: Medication: Goal: Compliance with prescribed medication regimen will improve Outcome: Progressing   Problem: Education: Goal: Knowledge of [facility] Education information/materials will improve Outcome: Progressing   Problem: Activity: Goal: Interest or engagement in activities will improve Outcome: Not Progressing

## 2024-03-01 NOTE — Group Note (Signed)
 Date:  03/01/2024 Time:  5:39 PM  Group Topic/Focus:  Personal Choices and Values:   The focus of this group is to help patients assess and explore the importance of values in their lives, how their values affect their decisions, how they express their values and what opposes their expression.    Participation Level:  Did Not Attend   [name] 03/01/2024, 5:39 PM
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
## 2024-03-01 NOTE — Group Note (Signed)
 Date:  03/01/2024 Time:  10:52 AM  Group Topic/Focus:  Goals/Boundaries Group:   The focus of this group is to help patients establish daily goals to achieve during treatment and we also discussed what boundaries is and how to establish healthy boundaries in their relationships.     Participation Level:  Did Not Attend  [name] [name] 03/01/2024, 10:52 AM

## 2024-03-01 NOTE — Progress Notes (Signed)
 Mood/Behavior:  Pt is pleasant and cooperative.  No behavioral issues.   Psych assessment:  Endorses sadness and depression. Denies SI.  Interaction / Group attendance:  Isolates to room with the exception of meals.  Minimal interaction with staff.  No interaction with peers.   Medication/ PRNs:  Compliant with scheduled medications. PRN medication for pain given as ordered.   Pain:  8/10 (left eye/face)  15 min checks in place for safety.    Pt is possibly filling Ensure containers with water  from bathroom.  Per Dr. Ruther, remove all excess containers from pt's room.

## 2024-03-02 DIAGNOSIS — F332 Major depressive disorder, recurrent severe without psychotic features: Secondary | ICD-10-CM | POA: Diagnosis not present

## 2024-03-02 MED ORDER — DOCUSATE SODIUM 100 MG PO CAPS
100.0000 mg | ORAL_CAPSULE | Freq: Every day | ORAL | Status: DC
  Administered 2024-03-02 – 2024-03-03 (×4): 100 mg via ORAL
  Filled 2024-03-02 (×2): qty 1
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
## 2024-03-02 NOTE — Plan of Care (Signed)
  Problem: Education: Goal: Ability to make informed decisions regarding treatment will improve Outcome: Progressing   Problem: Coping: Goal: Coping ability will improve Outcome: Progressing   Problem: Health Behavior/Discharge Planning: Goal: Identification of resources available to assist in meeting health care needs will improve Outcome: Progressing   Problem: Medication: Goal: Compliance with prescribed medication regimen will improve Outcome: Progressing   Problem: Self-Concept: Goal: Ability to disclose and discuss suicidal ideas will improve Outcome: Progressing Goal: Will verbalize positive feelings about self Outcome: Progressing Note: Patient is on track. Patient will work on increased adherence, avoid flare triggers, and be evaluated at upcoming provider appointment to assess progress    Problem: Education: Goal: Knowledge of [place] [facility] Education information/materials will improve Outcome: Progressing Goal: Emotional status will improve Outcome: Progressing Goal: Mental status will improve Outcome: Progressing Goal: Verbalization of understanding the information provided will improve Outcome: Progressing   Problem: Activity: Goal: Interest or engagement in activities will improve Outcome: Progressing Goal: Sleeping patterns will improve Outcome: Progressing   Problem: Coping: Goal: Ability to verbalize frustrations and anger appropriately will improve Outcome: Progressing Goal: Ability to demonstrate self-control will improve Outcome: Progressing   Problem: Health Behavior/Discharge Planning: Goal: Identification of resources available to assist in meeting health care needs will improve Outcome: Progressing Goal: Compliance with treatment plan for underlying cause of condition will improve Outcome: Progressing   Problem: Physical Regulation: Goal: Ability to maintain clinical measurements within normal limits will improve Outcome:  Progressing   Problem: Safety: Goal: Periods of time without injury will increase Outcome: Progressing   Problem: Education: Goal: Utilization of techniques to improve thought processes will improve Outcome: Progressing Goal: Knowledge of the prescribed therapeutic regimen will improve Outcome: Progressing   Problem: Activity: Goal: Interest or engagement in leisure activities will improve Outcome: Progressing Goal: Imbalance in normal sleep/wake cycle will improve Outcome: Progressing   Problem: Coping: Goal: Coping ability will improve Outcome: Progressing Goal: Will verbalize feelings Outcome: Progressing   Problem: Health Behavior/Discharge Planning: Goal: Ability to make decisions will improve Outcome: Progressing Goal: Compliance with therapeutic regimen will improve Outcome: Progressing   Problem: Role Relationship: Goal: Will demonstrate positive changes in social behaviors and relationships Outcome: Progressing   Problem: Safety: Goal: Ability to disclose and discuss suicidal ideas will improve Outcome: Progressing Goal: Ability to identify and utilize support systems that promote safety will improve Outcome: Progressing   Problem: Self-Concept: Goal: Will verbalize positive feelings about self Outcome: Progressing Goal: Level of anxiety will decrease Outcome: Progressing   Problem: Education: Goal: Ability to state activities that reduce stress will improve Outcome: Progressing   Problem: Coping: Goal: Ability to identify and develop effective coping behavior will improve Outcome: Progressing   Problem: Self-Concept: Goal: Ability to identify factors that promote anxiety will improve Outcome: Progressing Goal: Level of anxiety will decrease Outcome: Progressing Goal: Ability to modify response to factors that promote anxiety will improve Outcome: Progressing

## 2024-03-02 NOTE — Progress Notes (Signed)
   03/02/24 2100  Psych Admission Type (Psych Patients Only)  Admission Status Voluntary  Psychosocial Assessment  Patient Complaints None  Eye Contact Brief  Facial Expression Sad  Affect Sad  Speech Logical/coherent;Soft  Interaction Minimal  Motor Activity Slow  Appearance/Hygiene In scrubs  Behavior Characteristics Appropriate to situation;Cooperative  Mood Pleasant  Thought Process  Coherency WDL  Content WDL  Delusions None reported or observed  Perception WDL  Hallucination None reported or observed  Judgment Impaired  Confusion None  Danger to Self  Current suicidal ideation? Denies  Danger to Others  Danger to Others None reported or observed   Mood/Behavior:  Pleasant and cooperative.  Sad affect.     Psych assessment:  Denies S/HI/AVH. Denies anxiety and depression.     Interaction / Group attendance:  Isolates to room. Did not attend group. Minimal interaction with staff.    Medication/ PRNs:  Compliant with scheduled medications. PRN pain medication given as ordered. Pt required prn oxycodone  for facial/jaw pain and medication noted effective. Pt required prn Trazodone  for sleep and medication noted effective.   Pain: Facial/Jaw pain.   15 min checks in place for safety.

## 2024-03-02 NOTE — Group Note (Signed)
 Date:  03/02/2024 Time:  10:58 AM  Group Topic/Focus:  Goals Group:   The focus of this group is to help patients establish daily goals to achieve during treatment and discuss how the patient can incorporate goal setting into their daily lives to aide in recovery.    Participation Level:  Did Not Attend  [name] [name] 03/02/2024, 10:58 AM

## 2024-03-02 NOTE — Plan of Care (Signed)
  Problem: Education: Goal: Ability to make informed decisions regarding treatment will improve Outcome: Progressing   Problem: Coping: Goal: Coping ability will improve Outcome: Progressing   Problem: Health Behavior/Discharge Planning: Goal: Identification of resources available to assist in meeting health care needs will improve Outcome: Progressing   Problem: Medication: Goal: Compliance with prescribed medication regimen will improve Outcome: Progressing   Problem: Self-Concept: Goal: Ability to disclose and discuss suicidal ideas will improve Outcome: Progressing Goal: Will verbalize positive feelings about self Outcome: Progressing Note: Patient is on track. Patient will work on increased adherence    Problem: Education: Goal: Knowledge of [place] [facility] Education information/materials will improve Outcome: Progressing Goal: Emotional status will improve Outcome: Progressing Goal: Mental status will improve Outcome: Progressing Goal: Verbalization of understanding the information provided will improve Outcome: Progressing   Problem: Activity: Goal: Interest or engagement in activities will improve Outcome: Progressing Goal: Sleeping patterns will improve Outcome: Progressing   Problem: Coping: Goal: Ability to verbalize frustrations and anger appropriately will improve Outcome: Progressing Goal: Ability to demonstrate self-control will improve Outcome: Progressing   Problem: Health Behavior/Discharge Planning: Goal: Identification of resources available to assist in meeting health care needs will improve Outcome: Progressing Goal: Compliance with treatment plan for underlying cause of condition will improve Outcome: Progressing   Problem: Physical Regulation: Goal: Ability to maintain clinical measurements within normal limits will improve Outcome: Progressing   Problem: Safety: Goal: Periods of time without injury will increase Outcome:  Progressing   Problem: Education: Goal: Utilization of techniques to improve thought processes will improve Outcome: Progressing Goal: Knowledge of the prescribed therapeutic regimen will improve Outcome: Progressing   Problem: Activity: Goal: Interest or engagement in leisure activities will improve Outcome: Progressing Goal: Imbalance in normal sleep/wake cycle will improve Outcome: Progressing   Problem: Coping: Goal: Coping ability will improve Outcome: Progressing Goal: Will verbalize feelings Outcome: Progressing   Problem: Health Behavior/Discharge Planning: Goal: Ability to make decisions will improve Outcome: Progressing Goal: Compliance with therapeutic regimen will improve Outcome: Progressing   Problem: Role Relationship: Goal: Will demonstrate positive changes in social behaviors and relationships Outcome: Progressing   Problem: Safety: Goal: Ability to disclose and discuss suicidal ideas will improve Outcome: Progressing Goal: Ability to identify and utilize support systems that promote safety will improve Outcome: Progressing   Problem: Self-Concept: Goal: Will verbalize positive feelings about self Outcome: Progressing Goal: Level of anxiety will decrease Outcome: Progressing   Problem: Education: Goal: Ability to state activities that reduce stress will improve Outcome: Progressing   Problem: Coping: Goal: Ability to identify and develop effective coping behavior will improve Outcome: Progressing   Problem: Self-Concept: Goal: Ability to identify factors that promote anxiety will improve Outcome: Progressing Goal: Level of anxiety will decrease Outcome: Progressing Goal: Ability to modify response to factors that promote anxiety will improve Outcome: Progressing

## 2024-03-02 NOTE — Group Note (Signed)
 Date:  03/02/2024 Time:  5:25 PM  Group Topic/Focus:  Building Self Esteem:   The Focus of this group is helping patients become aware of the effects of self-esteem on their lives, the things they and others do that enhance or undermine their self-esteem, seeing the relationship between their level of self-esteem and the choices they make and learning ways to enhance self-esteem.    Participation Level:  Did Not Attend   [name] [name] 03/02/2024, 5:25 PM

## 2024-03-02 NOTE — Progress Notes (Signed)
 Mood/Behavior:  Pleasant and cooperative.  Sad affect.     Psych assessment:  Endorses sadness and depression.  Denies SI.    Interaction / Group attendance:  Isolates to room with the exception of meals.  Minimal interaction with peers and staff.  Does not attend groups.   Medication/ PRNs:  Compliant with scheduled medications. PRN pain medication given as ordered.   Pain:  Denies  15 min checks in place for safety.    Patient given only thicken liquids to drink  No straws.  Old/ extra containers removed from room.

## 2024-03-02 NOTE — BH IP Treatment Plan (Signed)
 Interdisciplinary Treatment and Diagnostic Plan Update  03/02/2024 Time of Session: 9:00 AM  [name] MRN: [id number]  Principal Diagnosis: MDD (major depressive disorder), recurrent severe, without psychosis (HCC)  Secondary Diagnoses: Principal Problem:   MDD (major depressive disorder), recurrent severe, without psychosis (HCC)   Current Medications:  Current Facility-Administered Medications  Medication Dose Route Frequency Provider Last Rate Last Admin   acetaminophen  (TYLENOL ) tablet 650 mg  650 mg Oral Q6H PRN Jadapalle, Sree, MD       alum & mag hydroxide-simeth (MAALOX/MYLANTA) 200-200-20 MG/5ML suspension 30 mL  30 mL Oral Q4H PRN Jadapalle, Sree, MD       artificial tears (LACRILUBE) ophthalmic ointment   Left Eye QHS Jadapalle, Sree, MD   Given at 03/01/24 2024   artificial tears ophthalmic solution 2 drop  2 drop Left Eye Q1H while awake Jadapalle, Sree, MD   2 drop at 03/02/24 1059   atorvastatin  (LIPITOR) tablet 20 mg  20 mg Oral Daily Jadapalle, Sree, MD   20 mg at 03/02/24 9157   doxazosin  (CARDURA ) tablet 1 mg  1 mg Oral Daily Jadapalle, Sree, MD   1 mg at 03/02/24 9157   enoxaparin  (LOVENOX ) injection 40 mg  40 mg Subcutaneous Daily Jadapalle, Sree, MD   40 mg at 03/02/24 9158   erythromycin  ophthalmic ointment 1 Application  1 Application Left Eye BID Donnelly Mellow, MD   1 Application at 03/02/24 0841   feeding supplement (ENSURE PLUS HIGH PROTEIN) liquid 237 mL  237 mL Oral TID BM Jadapalle, Sree, MD   237 mL at 03/02/24 0840   food thickener (SIMPLYTHICK (NECTAR/LEVEL 2/MILDLY THICK)) 1 packet  1 packet Oral PRN Jadapalle, Sree, MD       hydrOXYzine  (ATARAX ) tablet 25 mg  25 mg Oral Q6H PRN Jadapalle, Sree, MD   25 mg at 03/02/24 0015   latanoprost  (XALATAN ) 0.005 % ophthalmic solution 1 drop  1 drop Both Eyes QHS Donnelly Mellow, MD   1 drop at 03/01/24 2025   magnesium  hydroxide (MILK OF MAGNESIA) suspension 30 mL  30 mL Oral Daily PRN Jadapalle, Sree, MD        multivitamin with minerals tablet 1 tablet  1 tablet Oral Daily Jadapalle, Sree, MD   1 tablet at 03/02/24 9157   OLANZapine  (ZYPREXA ) injection 5 mg  5 mg Intramuscular TID PRN Jadapalle, Sree, MD       OLANZapine  zydis (ZYPREXA ) disintegrating tablet 5 mg  5 mg Oral TID PRN Jadapalle, Sree, MD       oxyCODONE  (Oxy IR/ROXICODONE ) immediate release tablet 2.5 mg  2.5 mg Oral Q6H PRN Jadapalle, Sree, MD   2.5 mg at 03/02/24 9157   ramelteon  (ROZEREM ) tablet 8 mg  8 mg Oral QHS Jadapalle, Sree, MD   8 mg at 03/01/24 2023   timolol  (TIMOPTIC ) 0.5 % ophthalmic solution 1 drop  1 drop Both Eyes Daily Jadapalle, Sree, MD   1 drop at 03/02/24 9157   traZODone  (DESYREL ) tablet 50 mg  50 mg Oral QHS PRN Jadapalle, Sree, MD   50 mg at 03/02/24 0014   venlafaxine  XR (EFFEXOR -XR) 24 hr capsule 37.5 mg  37.5 mg Oral Q breakfast Jadapalle, Sree, MD   37.5 mg at 03/02/24 9157   PTA Medications: Medications Prior to Admission  Medication Sig Dispense Refill Last Dose/Taking   acetaminophen  (TYLENOL ) 325 MG tablet Take 2 tablets (650 mg total) by mouth every 6 (six) hours as needed for mild pain (pain score 1-3). 30 tablet  0    artificial tears (LACRILUBE) OINT ophthalmic ointment Place into the left eye at bedtime.      artificial tears ophthalmic solution Place 2 drops into the left eye every hour while awake.      atorvastatin  (LIPITOR) 20 MG tablet Take 20 mg by mouth daily.      bacitracin  ointment Apply topically 3 (three) times daily.      doxazosin  (CARDURA ) 1 MG tablet Take 1 tablet (1 mg total) by mouth daily.      enoxaparin  (LOVENOX ) 30 MG/0.3ML injection Inject 0.3 mLs (30 mg total) into the skin every 12 (twelve) hours. 18 mL 0    erythromycin  ophthalmic ointment Place 1 Application into the left eye in the morning and at bedtime for 7 days. 3.5 g 0    feeding supplement (ENSURE PLUS HIGH PROTEIN) LIQD Take 237 mLs by mouth 3 (three) times daily between meals.      latanoprost  (XALATAN ) 0.005 %  ophthalmic solution Place 1 drop into both eyes at bedtime. 2.5 mL 12    loperamide  (IMODIUM ) 2 MG capsule Take 1 capsule (2 mg total) by mouth 2 (two) times daily.      Multiple Vitamin (MULTIVITAMIN WITH MINERALS) TABS tablet Take 1 tablet by mouth daily.      oxyCODONE  (OXY IR/ROXICODONE ) 5 MG immediate release tablet Take 0.5 tablets (2.5 mg total) by mouth every 6 (six) hours as needed for severe pain (pain score 7-10). 10 tablet 0    ramelteon  (ROZEREM ) 8 MG tablet Take 1 tablet (8 mg total) by mouth at bedtime.      sertraline  (ZOLOFT ) 100 MG tablet Take 1 tablet (100 mg total) by mouth daily.      timolol  (TIMOPTIC ) 0.5 % ophthalmic solution Place 1 drop into both eyes daily.       Patient Stressors:    Patient Strengths:    Treatment Modalities: Medication Management, Group therapy, Case management,  1 to 1 session with clinician, Psychoeducation, Recreational therapy.   Physician Treatment Plan for Primary Diagnosis: MDD (major depressive disorder), recurrent severe, without psychosis (HCC) Long Term Goal(s):     Short Term Goals:    Medication Management: Evaluate patient's response, side effects, and tolerance of medication regimen.  Therapeutic Interventions: 1 to 1 sessions, Unit Group sessions and Medication administration.  Evaluation of Outcomes: Progressing  Physician Treatment Plan for Secondary Diagnosis: Principal Problem:   MDD (major depressive disorder), recurrent severe, without psychosis (HCC)  Long Term Goal(s):     Short Term Goals:       Medication Management: Evaluate patient's response, side effects, and tolerance of medication regimen.  Therapeutic Interventions: 1 to 1 sessions, Unit Group sessions and Medication administration.  Evaluation of Outcomes: Progressing   RN Treatment Plan for Primary Diagnosis: MDD (major depressive disorder), recurrent severe, without psychosis (HCC) Long Term Goal(s): Knowledge of disease and therapeutic  regimen to maintain health will improve  Short Term Goals: Ability to remain free from injury will improve, Ability to verbalize frustration and anger appropriately will improve, Ability to demonstrate self-control, Ability to participate in decision making will improve, Ability to verbalize feelings will improve, Ability to disclose and discuss suicidal ideas, Ability to identify and develop effective coping behaviors will improve, and Compliance with prescribed medications will improve  Medication Management: RN will administer medications as ordered by provider, will assess and evaluate patient's response and provide education to patient for prescribed medication. RN will report any adverse and/or side effects to prescribing  provider.  Therapeutic Interventions: 1 on 1 counseling sessions, Psychoeducation, Medication administration, Evaluate responses to treatment, Monitor vital signs and CBGs as ordered, Perform/monitor CIWA, COWS, AIMS and Fall Risk screenings as ordered, Perform wound care treatments as ordered.  Evaluation of Outcomes: Progressing   LCSW Treatment Plan for Primary Diagnosis: MDD (major depressive disorder), recurrent severe, without psychosis (HCC) Long Term Goal(s): Safe transition to appropriate next level of care at discharge, Engage patient in therapeutic group addressing interpersonal concerns.  Short Term Goals: Engage patient in aftercare planning with referrals and resources, Increase social support, Increase ability to appropriately verbalize feelings, Increase emotional regulation, Facilitate acceptance of mental health diagnosis and concerns, Facilitate patient progression through stages of change regarding substance use diagnoses and concerns, Identify triggers associated with mental health/substance abuse issues, and Increase skills for wellness and recovery  Therapeutic Interventions: Assess for all discharge needs, 1 to 1 time with Social worker, Explore  available resources and support systems, Assess for adequacy in community support network, Educate family and significant other(s) on suicide prevention, Complete Psychosocial Assessment, Interpersonal group therapy.  Evaluation of Outcomes: Progressing   Progress in Treatment: Attending groups: Yes. and No. Participating in groups: Yes. and No. Taking medication as prescribed: Yes. Toleration medication: Yes. Family/Significant other contact made:Yes, [place] Damore, son Patient understands diagnosis: Yes. Discussing patient identified problems/goals with staff: Yes. Medical problems stabilized or resolved: Yes. and No. Denies suicidal/homicidal ideation: Yes. Issues/concerns per patient self-inventory: No. Other: None   New problem(s) identified: No, Describe:  None Update 03/02/24: No changes at this time   New Short Term/Long Term Goal(s):detox, elimination of symptoms of psychosis, medication management for mood stabilization; elimination of SI thoughts; development of comprehensive mental wellness/sobriety plan.  Update 03/02/24: No changes at this time   Patient Goals:  See my general mood improve.Update 03/02/24: No changes at this time   Discharge Plan or Barriers: CSW to assist with the development of appropriate discharge plan.  Update 03/02/24: No changes at this time   Reason for Continuation of Hospitalization: Anxiety Depression Medical Issues Medication stabilization Suicidal ideation   Estimated Length of Stay: 1-7 days. Update 03/02/24:  03/04/24  Last 3 [place] Suicide Severity Risk Score: Flowsheet Row Admission (Discharged) from 02/23/2024 in [facility] BE[facility]AVIORAL [facility]  C-SSRS RISK CATEGORY High Risk    Last PHQ 2/9 Scores:     No data to display          Scribe for Treatment Team: Lum JONETTA Raynaldo ISRAEL 03/02/2024 12:49 PM

## 2024-03-02 NOTE — Plan of Care (Signed)
  Problem: Coping: Goal: Coping ability will improve Outcome: Progressing   Problem: Medication: Goal: Compliance with prescribed medication regimen will improve Outcome: Progressing   Problem: Education: Goal: Mental status will improve Outcome: Progressing

## 2024-03-03 DIAGNOSIS — F332 Major depressive disorder, recurrent severe without psychotic features: Secondary | ICD-10-CM | POA: Diagnosis not present

## 2024-03-03 MED ORDER — VENLAFAXINE HCL ER 75 MG PO CP24
75.0000 mg | ORAL_CAPSULE | Freq: Every day | ORAL | Status: DC
  Administered 2024-03-04: 75 mg via ORAL
  Filled 2024-03-03: qty 1

## 2024-03-03 MED ORDER — VENLAFAXINE HCL ER 75 MG PO CP24: 75.0000 mg | ORAL_CAPSULE | Freq: Every day | ORAL | 0 refills | Status: DC

## 2024-03-03 MED ORDER — DOCUSATE SODIUM 100 MG PO CAPS: 100.0000 mg | ORAL_CAPSULE | Freq: Every day | ORAL | 0 refills | Status: DC

## 2024-03-03 MED ORDER — ERYTHROMYCIN 5 MG/GM OP OINT: 1.0000 | TOPICAL_OINTMENT | Freq: Two times a day (BID) | OPHTHALMIC | 0 refills | Status: DC

## 2024-03-03 NOTE — Group Note (Signed)
 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 Patients and LRT discuss what it means to be "at peace", what it feels like physically and mentally. Pts are given a canvas and watercolor paint to use and encouraged to draw their idea of a peaceful place. Pts and LRT discuss how they use this in their daily life post discharge. Pts are encouraged to take their canvas home with them as a reminder to find their peaceful place whenever they are feeling depressed, anxious, etc.    Goal Area(s) Addressed:  Patient will identify what it means to experience a "peaceful" emotion. Patient will identify a new coping skill.  Patient will express their emotions through art. Patients will increase communication by talking with LRT and peers while in group.   Affect/Mood: N/A   Participation Level: Did not attend    Clinical Observations/Individualized Feedback: Patient did not attend group.  Plan: Continue to engage patient in RT group sessions 2-3x/week.   Jeoffrey BRAVO Celestia, LRT, CTRS 03/03/2024 4:57 PM

## 2024-03-03 NOTE — Progress Notes (Signed)
   03/03/24 2300  Psych Admission Type (Psych Patients Only)  Admission Status Voluntary  Psychosocial Assessment  Patient Complaints None  Eye Contact Brief  Facial Expression Sad;Flat  Affect Flat;Depressed;Sad  Speech Logical/coherent  Interaction Isolative;Minimal  Motor Activity Slow  Appearance/Hygiene In scrubs  Behavior Characteristics Cooperative  Mood Sad  Thought Process  Coherency WDL  Content WDL  Delusions None reported or observed  Perception WDL  Hallucination None reported or observed  Judgment Impaired  Confusion None  Danger to Self  Current suicidal ideation? Denies  Danger to Others  Danger to Others None reported or observed

## 2024-03-03 NOTE — Progress Notes (Signed)
   03/03/24 1223  Psych Admission Type (Psych Patients Only)  Admission Status Voluntary  Psychosocial Assessment  Patient Complaints None  Eye Contact Brief  Facial Expression Sad  Affect Sad;Depressed  Speech Logical/coherent  Interaction Minimal  Motor Activity Slow  Appearance/Hygiene In scrubs  Behavior Characteristics Cooperative  Mood Pleasant  Thought Process  Coherency WDL  Content WDL  Delusions None reported or observed  Perception WDL  Hallucination None reported or observed  Judgment Impaired  Confusion None  Danger to Self  Current suicidal ideation? Denies  Danger to Others  Danger to Others None reported or observed

## 2024-03-03 NOTE — BHH Suicide Risk Assessment (Signed)
 [facility] Discharge Suicide Risk Assessment   Principal Problem: MDD (major depressive disorder), recurrent severe, without psychosis (HCC) Discharge Diagnoses: Principal Problem:   MDD (major depressive disorder), recurrent severe, without psychosis (HCC)   Total Time spent with patient: 30 minutes  Musculoskeletal: Strength & Muscle Tone: decreased Gait & Station: unsteady Patient leans: N/A  Psychiatric Specialty Exam  Presentation  General Appearance:  Appropriate for Environment; Casual  Eye Contact: Fair  Speech: Clear and Coherent  Speech Volume: Normal  Handedness: Right   Mood and Affect  Mood: Euthymic  Duration of Depression Symptoms: No data recorded Affect: Appropriate   Thought Process  Thought Processes: Coherent  Descriptions of Associations:Intact  Orientation:Full (Time, Place and Person)  Thought Content:Logical  History of Schizophrenia/Schizoaffective disorder:No data recorded Duration of Psychotic Symptoms:No data recorded Hallucinations:Hallucinations: None  Ideas of Reference:None  Suicidal Thoughts:Suicidal Thoughts: No  Homicidal Thoughts:Homicidal Thoughts: No   Sensorium  Memory: Recent Fair; Immediate Fair  Judgment: Fair  Insight: Poor   Executive Functions  Concentration: Fair  Attention Span: Fair  Recall: [employer] of Knowledge: Fair  Language: Fair   Psychomotor Activity  Psychomotor Activity: Psychomotor Activity: Normal   Assets  Assets: Communication Skills; Resilience   Sleep  Sleep: Sleep: Fair  Estimated Sleeping Duration (Last 24 Hours): 9.25-11.25 hours  Physical Exam: Physical Exam Vitals and nursing note reviewed.    ROS Blood pressure (!) 148/83, pulse 68, temperature 97.9 F (36.6 C), resp. rate 16, SpO2 97%. There is no height or weight on file to calculate BMI.  Mental Status Per Nursing Assessment::   On Admission:     Demographic Factors:  Age 89 or  older  Loss Factors: Decrease in vocational status  Historical Factors: Impulsivity  Risk Reduction Factors:   Living with another person, especially a relative, Positive social support, Positive therapeutic relationship, and Positive coping skills or problem solving skills  Continued Clinical Symptoms:  Depression:   Impulsivity  Cognitive Features That Contribute To Risk:  None    Suicide Risk:  Minimal: No identifiable suicidal ideation.  Patients presenting with no risk factors but with morbid ruminations; may be classified as minimal risk based on the severity of the depressive symptoms   Follow-up Information     [facility] Follow up.   Why: In person assessment for therapy and medication management is Contact information: [address]  [address] [place] [address] [phone]                 Plan Of Care/Follow-up recommendations:  Activity:  As tolerated  Allyn Foil, MD 03/03/2024, 9:21 PM

## 2024-03-03 NOTE — BHH Counselor (Signed)
 CSW spoke with the patient's son, [place] Hooper.   Son inquired about when patient can be picked up from the hospital.  CSW explained that there is not exact time, however discharges typically occur between 12 and 1PM.   CSW explained that it depends on when all documentation has been completed and there is no way of knowing when that will be.  Son inquired I can just come and get him tonight then.  CSW explained that patient will not be discharged from the hospital today therefore patient can not be picked up this evening.  Son then stated so you all have involuntarily committed him then. CSW explained that the patient is not under IVC, however, would have to be discharged from the hospital to go home and at this time there are no orders in place allowing patient to be discharged.   Son became upset with CSW as evidenced by tone and manner of speech.  CSW explained again that discharged typically are discharged around 1PM but an exact time can not be provided at this time.    CSW explaiend that a call can be provided tomorrow.   Sherryle Margo, MSW, LCSW 03/03/2024 4:32 PM

## 2024-03-03 NOTE — Plan of Care (Signed)
  Problem: Education: Goal: Ability to make informed decisions regarding treatment will improve Outcome: Progressing   Problem: Coping: Goal: Coping ability will improve Outcome: Progressing   Problem: Health Behavior/Discharge Planning: Goal: Identification of resources available to assist in meeting health care needs will improve Outcome: Progressing   Problem: Medication: Goal: Compliance with prescribed medication regimen will improve Outcome: Progressing   Problem: Self-Concept: Goal: Ability to disclose and discuss suicidal ideas will improve Outcome: Progressing Goal: Will verbalize positive feelings about self Outcome: Progressing Note: Patient is on track. Patient will maintain adherence Pt denies SI or HI at this time.    Problem: Education: Goal: Knowledge of [facility] Education information/materials will improve Outcome: Progressing Goal: Emotional status will improve Outcome: Progressing Goal: Mental status will improve Outcome: Progressing Goal: Verbalization of understanding the information provided will improve Outcome: Progressing   Problem: Activity: Goal: Interest or engagement in activities will improve Outcome: Progressing Goal: Sleeping patterns will improve Outcome: Progressing   Problem: Coping: Goal: Ability to verbalize frustrations and anger appropriately will improve Outcome: Progressing Goal: Ability to demonstrate self-control will improve Outcome: Progressing   Problem: Health Behavior/Discharge Planning: Goal: Identification of resources available to assist in meeting health care needs will improve Outcome: Progressing Goal: Compliance with treatment plan for underlying cause of condition will improve Outcome: Progressing   Problem: Physical Regulation: Goal: Ability to maintain clinical measurements within normal limits will improve Outcome: Progressing   Problem: Safety: Goal: Periods of time without injury will  increase Outcome: Progressing   Problem: Education: Goal: Utilization of techniques to improve thought processes will improve Outcome: Progressing Goal: Knowledge of the prescribed therapeutic regimen will improve Outcome: Progressing   Problem: Activity: Goal: Interest or engagement in leisure activities will improve Outcome: Progressing Goal: Imbalance in normal sleep/wake cycle will improve Outcome: Progressing   Problem: Coping: Goal: Coping ability will improve Outcome: Progressing Goal: Will verbalize feelings Outcome: Progressing   Problem: Health Behavior/Discharge Planning: Goal: Ability to make decisions will improve Outcome: Progressing Goal: Compliance with therapeutic regimen will improve Outcome: Progressing   Problem: Role Relationship: Goal: Will demonstrate positive changes in social behaviors and relationships Outcome: Progressing   Problem: Safety: Goal: Ability to disclose and discuss suicidal ideas will improve Outcome: Progressing Goal: Ability to identify and utilize support systems that promote safety will improve Outcome: Progressing   Problem: Self-Concept: Goal: Will verbalize positive feelings about self Outcome: Progressing Goal: Level of anxiety will decrease Outcome: Progressing   Problem: Education: Goal: Ability to state activities that reduce stress will improve Outcome: Progressing   Problem: Coping: Goal: Ability to identify and develop effective coping behavior will improve Outcome: Progressing   Problem: Self-Concept: Goal: Ability to identify factors that promote anxiety will improve Outcome: Progressing Goal: Level of anxiety will decrease Outcome: Progressing Goal: Ability to modify response to factors that promote anxiety will improve Outcome: Progressing
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
## 2024-03-03 NOTE — Group Note (Signed)
 Physical/Occupational Therapy Group Note  Group Topic: Transfer Training   Group Date: 03/03/2024 Start Time: 1305 End Time: 1335 Facilitators: Zaniah Titterington, Alm Hamilton, PT   Group Description: Group educated on sequence and techniques to maximize safety with functional transfers.  Additionally, integrated education on impact of seating surfaces, use of assistive device and management of orthostasis with movement transitions.  Patients actively engaged with functional transfers (sit/stand) from various seating surfaces, with and without assist devices, working to integrate and retain education provided during session.  Allowed time for questions and further discussion on mobility concerns/needs.   Therapeutic Goal(s):  Identify and demonstrate safe technique for sit/stand transfers from various seating surfaces. Identify and demonstrate safe use of assistive devices with basic transfers and simple mobility. Identify and demonstrate ability to recognize signs/symptoms of orthostasis and appropriate compensatory/safety techniques.  Individual Participation: Did not attend  Participation Level:   Participation Quality:   Behavior:   Speech/Thought Process:   Affect/Mood:   Insight:   Judgement:   Individualization:   Modes of Intervention:   Patient Response to Interventions:    Plan: Continue to engage patient in PT/OT groups 1 - 2x/week.  CHARM Hamilton Bertin PT, DPT 03/03/24, 2:36 PM

## 2024-03-03 NOTE — Group Note (Signed)
 Date:  03/03/2024 Time:  11:12 AM  Group Topic/Focus:  Recovery Goals:   The focus of this group is to identify appropriate goals for recovery and establish a plan to achieve them.    Participation Level:  Did Not Attend   [name] [name] 03/03/2024, 11:12 AM

## 2024-03-03 NOTE — Group Note (Signed)
 Date:  03/03/2024 Time:  12:35 AM  Group Topic/Focus:  Wrap-Up Group:   The focus of this group is to help patients review their daily goal of treatment and discuss progress on daily workbooks.    Participation Level:  Did Not Attend  Participation Quality:     Affect:     Cognitive:     Insight: None  Engagement in Group:  None  Modes of Intervention:     Additional Comments:    Tommas [name] 03/03/2024, 12:35 AM

## 2024-03-03 NOTE — Group Note (Signed)
 Date:  03/03/2024 Time:  11:32 PM  Group Topic/Focus:  Wrap-Up Group:   The focus of this group is to help patients review their daily goal of treatment and discuss progress on daily workbooks.    Participation Level:  Did Not Attend  Participation Quality:     Affect:     Cognitive:     Insight: None  Engagement in Group:  None  Modes of Intervention:    Additional Comments:    [name] [name] 03/03/2024, 11:32 PM

## 2024-03-04 ENCOUNTER — Other Ambulatory Visit: Payer: Self-pay | Admitting: Psychiatry

## 2024-03-04 ENCOUNTER — Encounter: Payer: Self-pay | Admitting: Internal Medicine

## 2024-03-04 DIAGNOSIS — F332 Major depressive disorder, recurrent severe without psychotic features: Secondary | ICD-10-CM | POA: Diagnosis not present

## 2024-03-04 MED ORDER — VENLAFAXINE HCL ER 75 MG PO CP24: 75.0000 mg | ORAL_CAPSULE | Freq: Every day | ORAL | 0 refills | Status: AC

## 2024-03-04 MED ORDER — DOCUSATE SODIUM 100 MG PO CAPS: 100.0000 mg | ORAL_CAPSULE | Freq: Every day | ORAL | 0 refills | Status: AC

## 2024-03-04 MED ORDER — OXYCODONE HCL 5 MG PO TABS: 2.5000 mg | ORAL_TABLET | Freq: Four times a day (QID) | ORAL | 0 refills | Status: DC | PRN

## 2024-03-04 MED ORDER — ERYTHROMYCIN 5 MG/GM OP OINT: 1.0000 | TOPICAL_OINTMENT | Freq: Two times a day (BID) | OPHTHALMIC | 0 refills | Status: AC

## 2024-03-04 NOTE — Progress Notes (Signed)
   03/04/24 0732  Psych Admission Type (Psych Patients Only)  Admission Status Voluntary  Psychosocial Assessment  Patient Complaints None  Eye Contact Fair  Facial Expression Flat  Affect Sullen;Depressed  Speech Logical/coherent  Interaction Isolative  Motor Activity Slow  Appearance/Hygiene In scrubs  Behavior Characteristics Cooperative  Mood Pleasant  Thought Process  Coherency WDL  Content WDL  Delusions None reported or observed  Perception WDL  Hallucination None reported or observed  Judgment Impaired  Confusion None  Danger to Self  Current suicidal ideation? Denies  Danger to Others  Danger to Others None reported or observed

## 2024-03-04 NOTE — Progress Notes (Signed)

## 2024-03-04 NOTE — Care Management Important Message (Signed)
 Important Message  Patient Details  Name: [name] [name] MRN: [id number] Date of Birth: 05/09/1934   Important Message Given:  Yes - Medicare IM     [name], LCSWA 03/04/2024, 10:26 AM

## 2024-03-04 NOTE — Plan of Care (Signed)
  Problem: Coping: Goal: Coping ability will improve Outcome: Adequate for Discharge   Problem: Education: Goal: Emotional status will improve Outcome: Adequate for Discharge Goal: Mental status will improve Outcome: Adequate for Discharge

## 2024-03-04 NOTE — B[facility] Counselor (Signed)
 Blank Medicare IM given to pt due to IM not being scanned in to medical records at this time  Lum Croft, MSW, [facility] 03/04/2024 10:27 AM

## 2024-03-04 NOTE — Plan of Care (Signed)
  Problem: Education: Goal: Ability to make informed decisions regarding treatment will improve Outcome: Adequate for Discharge   Problem: Coping: Goal: Coping ability will improve Outcome: Adequate for Discharge   Problem: Health Behavior/Discharge Planning: Goal: Identification of resources available to assist in meeting health care needs will improve Outcome: Adequate for Discharge   Problem: Medication: Goal: Compliance with prescribed medication regimen will improve Outcome: Adequate for Discharge   Problem: Self-Concept: Goal: Ability to disclose and discuss suicidal ideas will improve Outcome: Adequate for Discharge Goal: Will verbalize positive feelings about self Outcome: Adequate for Discharge   Problem: Education: Goal: Knowledge of [facility] Education information/materials will improve Outcome: Adequate for Discharge Goal: Emotional status will improve Outcome: Adequate for Discharge Goal: Mental status will improve Outcome: Adequate for Discharge Goal: Verbalization of understanding the information provided will improve Outcome: Adequate for Discharge   Problem: Activity: Goal: Interest or engagement in activities will improve Outcome: Adequate for Discharge Goal: Sleeping patterns will improve Outcome: Adequate for Discharge   Problem: Coping: Goal: Ability to verbalize frustrations and anger appropriately will improve Outcome: Adequate for Discharge Goal: Ability to demonstrate self-control will improve Outcome: Adequate for Discharge   Problem: Health Behavior/Discharge Planning: Goal: Identification of resources available to assist in meeting health care needs will improve Outcome: Adequate for Discharge Goal: Compliance with treatment plan for underlying cause of condition will improve Outcome: Adequate for Discharge   Problem: Physical Regulation: Goal: Ability to maintain clinical measurements within normal limits will improve Outcome:  Adequate for Discharge   Problem: Safety: Goal: Periods of time without injury will increase Outcome: Adequate for Discharge   Problem: Education: Goal: Utilization of techniques to improve thought processes will improve Outcome: Adequate for Discharge Goal: Knowledge of the prescribed therapeutic regimen will improve Outcome: Adequate for Discharge   Problem: Activity: Goal: Interest or engagement in leisure activities will improve Outcome: Adequate for Discharge Goal: Imbalance in normal sleep/wake cycle will improve Outcome: Adequate for Discharge   Problem: Coping: Goal: Coping ability will improve Outcome: Adequate for Discharge Goal: Will verbalize feelings Outcome: Adequate for Discharge   Problem: Health Behavior/Discharge Planning: Goal: Ability to make decisions will improve Outcome: Adequate for Discharge Goal: Compliance with therapeutic regimen will improve Outcome: Adequate for Discharge   Problem: Role Relationship: Goal: Will demonstrate positive changes in social behaviors and relationships Outcome: Adequate for Discharge   Problem: Safety: Goal: Ability to disclose and discuss suicidal ideas will improve Outcome: Adequate for Discharge Goal: Ability to identify and utilize support systems that promote safety will improve Outcome: Adequate for Discharge   Problem: Self-Concept: Goal: Will verbalize positive feelings about self Outcome: Adequate for Discharge Goal: Level of anxiety will decrease Outcome: Adequate for Discharge   Problem: Education: Goal: Ability to state activities that reduce stress will improve Outcome: Adequate for Discharge   Problem: Coping: Goal: Ability to identify and develop effective coping behavior will improve Outcome: Adequate for Discharge   Problem: Self-Concept: Goal: Ability to identify factors that promote anxiety will improve Outcome: Adequate for Discharge Goal: Level of anxiety will decrease Outcome:  Adequate for Discharge Goal: Ability to modify response to factors that promote anxiety will improve Outcome: Adequate for Discharge
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
## 2024-03-04 NOTE — Progress Notes (Signed)
  [facility] Adult Case Management Discharge Plan :  Will you be returning to the same living situation after discharge:  Yes,  pt will return home  At discharge, do you have transportation home?: Yes,  pt's son will pick him up  Do you have the ability to pay for your medications: Yes,  [employer] MEDICARE / [facility] MEDICARE  Release of information consent forms completed and in the chart;  Patient's signature needed at discharge.  Patient to Follow up at:  Follow-up Information     [facility] Follow up.   Why: In person assessment for therapy and medication management is 10/07 at 8:30 AM Contact information: [address]  [address] [place] [address] [phone]                 Next level of care provider has access to [facility] Link:no  Safety Planning and Suicide Prevention discussed: Yes,  [name], son, [phone],     Has patient been referred to the Quitline?: Patient does not use tobacco/nicotine products  Patient has been referred for addiction treatment: No known substance use disorder.  [address] LCSWA 03/04/2024, 10:23 AM

## 2024-03-04 NOTE — Group Note (Signed)
 Date:  03/04/2024 Time:  10:25 AM  Group Topic/Focus:  Movement Therapy    Participation Level:  Did Not Attend   [name] [name] 03/04/2024, 10:25 AM

## 2024-03-04 NOTE — Progress Notes (Signed)
(  Sleep Hours) -12.5 (Any PRNs that were needed, meds refused, or side effects to meds)- atarax  25mg ,oxycodone  2.5mg , trazodone  50mg , no meds refused, and no side effects to medications  (Any disturbances and when (visitation, over night)- n/a  (Concerns raised by the patient)- n/a  (SI/HI/AVH)- denies

## 2024-03-05 ENCOUNTER — Other Ambulatory Visit: Payer: Self-pay | Admitting: Psychiatry

## 2024-03-05 ENCOUNTER — Other Ambulatory Visit: Payer: Self-pay

## 2024-03-05 MED ORDER — OXYCODONE HCL 5 MG PO TABS: 2.5000 mg | ORAL_TABLET | Freq: Four times a day (QID) | ORAL | 0 refills | Status: AC | PRN

## 2024-03-05 MED ORDER — OXYCODONE HCL 5 MG PO TABS: 5.0000 mg | ORAL_TABLET | ORAL | 0 refills | Status: DC | PRN

## 2024-03-05 MED ORDER — OXYCODONE HCL 5 MG PO TABS: 2.5000 mg | ORAL_TABLET | Freq: Four times a day (QID) | ORAL | 0 refills | Status: DC | PRN

## 2024-03-05 NOTE — Addendum Note (Signed)
 Addended by: Reeder Brisby on: 03/05/2024 02:02 PM   Modules accepted: Orders

## 2024-04-25 ENCOUNTER — Telehealth (HOSPITAL_COMMUNITY): Payer: Self-pay | Admitting: *Deleted

## 2024-04-25 NOTE — Telephone Encounter (Signed)
 Attempted to contact son ([name]) to schedule patient for OP MBS. Left VM @ [phone] requesting a call back. RKEEL

## 2024-05-02 ENCOUNTER — Telehealth (HOSPITAL_COMMUNITY): Payer: Self-pay | Admitting: Internal Medicine

## 2024-05-02 NOTE — Telephone Encounter (Signed)
 Second attempt to contact son ([name]) to schedule patient for OP MBS. Left VM @ [phone] requesting a call back. (AHARRIS)

## 2024-05-09 ENCOUNTER — Telehealth (HOSPITAL_COMMUNITY): Payer: Self-pay | Admitting: Internal Medicine

## 2024-05-09 NOTE — Telephone Encounter (Signed)
 12/5: patient's son [name] advised the swallow study was scheduled on Dec. 19th- must be elsewhere because patient does not have op mbs appointment scheduled within [place]. [name] gave me verbal permission to cancel the duplicate order. AHARRIS
